# Patient Record
Sex: Female | Born: 1960 | Race: White | Hispanic: No | Marital: Married | Smoking: Former smoker
Health system: Southern US, Community
[De-identification: ages and names within clinical notes are randomized; demographics above are authoritative.]

## PROBLEM LIST (undated history)

## (undated) DIAGNOSIS — R519 Headache, unspecified: Secondary | ICD-10-CM

## (undated) DIAGNOSIS — K219 Gastro-esophageal reflux disease without esophagitis: Secondary | ICD-10-CM

## (undated) DIAGNOSIS — R51 Headache: Secondary | ICD-10-CM

## (undated) DIAGNOSIS — K759 Inflammatory liver disease, unspecified: Secondary | ICD-10-CM

## (undated) DIAGNOSIS — C50911 Malignant neoplasm of unspecified site of right female breast: Secondary | ICD-10-CM

## (undated) DIAGNOSIS — E039 Hypothyroidism, unspecified: Secondary | ICD-10-CM

## (undated) DIAGNOSIS — G43009 Migraine without aura, not intractable, without status migrainosus: Secondary | ICD-10-CM

## (undated) DIAGNOSIS — Z923 Personal history of irradiation: Secondary | ICD-10-CM

## (undated) DIAGNOSIS — C801 Malignant (primary) neoplasm, unspecified: Secondary | ICD-10-CM

## (undated) DIAGNOSIS — F419 Anxiety disorder, unspecified: Secondary | ICD-10-CM

## (undated) DIAGNOSIS — Z9889 Other specified postprocedural states: Secondary | ICD-10-CM

## (undated) DIAGNOSIS — R112 Nausea with vomiting, unspecified: Secondary | ICD-10-CM

## (undated) DIAGNOSIS — E0500 Thyrotoxicosis with diffuse goiter without thyrotoxic crisis or storm: Secondary | ICD-10-CM

## (undated) DIAGNOSIS — M329 Systemic lupus erythematosus, unspecified: Secondary | ICD-10-CM

## (undated) DIAGNOSIS — Z9221 Personal history of antineoplastic chemotherapy: Secondary | ICD-10-CM

## (undated) HISTORY — PX: TUBAL LIGATION: SHX77

## (undated) HISTORY — DX: Malignant neoplasm of unspecified site of right female breast: C50.911

---

## 1995-02-15 DIAGNOSIS — M329 Systemic lupus erythematosus, unspecified: Secondary | ICD-10-CM

## 1995-02-15 HISTORY — DX: Systemic lupus erythematosus, unspecified: M32.9

## 2008-03-31 ENCOUNTER — Emergency Department (HOSPITAL_COMMUNITY): Admission: EM | Admit: 2008-03-31 | Discharge: 2008-03-31 | Payer: Self-pay | Admitting: Emergency Medicine

## 2009-09-01 ENCOUNTER — Emergency Department (HOSPITAL_COMMUNITY): Admission: EM | Admit: 2009-09-01 | Discharge: 2009-09-02 | Payer: Self-pay | Admitting: Emergency Medicine

## 2009-09-02 ENCOUNTER — Emergency Department (HOSPITAL_COMMUNITY): Admission: RE | Admit: 2009-09-02 | Discharge: 2009-09-02 | Payer: Self-pay | Admitting: Emergency Medicine

## 2010-02-16 DIAGNOSIS — F329 Major depressive disorder, single episode, unspecified: Secondary | ICD-10-CM | POA: Insufficient documentation

## 2010-02-16 DIAGNOSIS — M797 Fibromyalgia: Secondary | ICD-10-CM | POA: Insufficient documentation

## 2010-02-16 DIAGNOSIS — F32A Depression, unspecified: Secondary | ICD-10-CM | POA: Insufficient documentation

## 2010-02-16 DIAGNOSIS — E079 Disorder of thyroid, unspecified: Secondary | ICD-10-CM | POA: Insufficient documentation

## 2010-05-01 LAB — HEPATIC FUNCTION PANEL
ALT: 37 U/L — ABNORMAL HIGH (ref 0–35)
AST: 29 U/L (ref 0–37)
Albumin: 4.3 g/dL (ref 3.5–5.2)
Alkaline Phosphatase: 94 U/L (ref 39–117)
Bilirubin, Direct: 0.2 mg/dL (ref 0.0–0.3)
Total Bilirubin: 1.1 mg/dL (ref 0.3–1.2)
Total Protein: 7.5 g/dL (ref 6.0–8.3)

## 2010-05-01 LAB — CBC
HCT: 43.7 % (ref 36.0–46.0)
HCT: 44 % (ref 36.0–46.0)
Hemoglobin: 14.8 g/dL (ref 12.0–15.0)
MCH: 28.2 pg (ref 26.0–34.0)
MCHC: 33.9 g/dL (ref 30.0–36.0)
MCV: 83.3 fL (ref 78.0–100.0)
Platelets: 261 10*3/uL (ref 150–400)
RBC: 5.24 MIL/uL — ABNORMAL HIGH (ref 3.87–5.11)

## 2010-05-01 LAB — DIFFERENTIAL
Basophils Relative: 0 % (ref 0–1)
Eosinophils Absolute: 0.5 10*3/uL (ref 0.0–0.7)
Lymphocytes Relative: 39 % (ref 12–46)
Lymphs Abs: 2.8 10*3/uL (ref 0.7–4.0)
Lymphs Abs: 4.6 10*3/uL — ABNORMAL HIGH (ref 0.7–4.0)
Neutrophils Relative %: 59 % (ref 43–77)

## 2010-05-01 LAB — URINALYSIS, ROUTINE W REFLEX MICROSCOPIC
Glucose, UA: NEGATIVE mg/dL
Nitrite: NEGATIVE
Protein, ur: NEGATIVE mg/dL
Specific Gravity, Urine: >1.03 — ABNORMAL HIGH (ref 1.005–1.030)
pH: 5.5 (ref 5.0–8.0)

## 2010-05-01 LAB — URINE MICROSCOPIC-ADD ON

## 2010-05-01 LAB — AMYLASE: Amylase: 33 U/L (ref 0–105)

## 2010-05-01 LAB — POCT CARDIAC MARKERS
CKMB, poc: <1 ng/mL — ABNORMAL LOW (ref 1.0–8.0)
Troponin i, poc: <0.05 ng/mL (ref 0.00–0.09)

## 2010-05-01 LAB — COMPREHENSIVE METABOLIC PANEL
AST: 33 U/L (ref 0–37)
GFR calc Af Amer: >60 mL/min (ref >60)
GFR calc non Af Amer: >60 mL/min (ref >60)
Glucose, Bld: 107 mg/dL — ABNORMAL HIGH (ref 70–99)
Sodium: 134 mEq/L — ABNORMAL LOW (ref 135–145)

## 2010-05-01 LAB — BASIC METABOLIC PANEL
Chloride: 103 mEq/L (ref 96–112)
GFR calc non Af Amer: >60 mL/min (ref >60)
Glucose, Bld: 103 mg/dL — ABNORMAL HIGH (ref 70–99)

## 2010-05-01 LAB — POCT PREGNANCY, URINE: Preg Test, Ur: NEGATIVE

## 2012-04-05 ENCOUNTER — Emergency Department (HOSPITAL_COMMUNITY)
Admission: EM | Admit: 2012-04-05 | Discharge: 2012-04-05 | Disposition: A | Discharge status: Home / Self Care | Payer: Managed Care, Other (non HMO) | Attending: Emergency Medicine | Admitting: Emergency Medicine

## 2012-04-05 ENCOUNTER — Emergency Department (HOSPITAL_COMMUNITY): Payer: Managed Care, Other (non HMO)

## 2012-04-05 ENCOUNTER — Encounter (HOSPITAL_COMMUNITY): Payer: Self-pay | Admitting: Emergency Medicine

## 2012-04-05 DIAGNOSIS — Z87891 Personal history of nicotine dependence: Secondary | ICD-10-CM | POA: Insufficient documentation

## 2012-04-05 DIAGNOSIS — R0789 Other chest pain: Secondary | ICD-10-CM | POA: Insufficient documentation

## 2012-04-05 DIAGNOSIS — Z8639 Personal history of other endocrine, nutritional and metabolic disease: Secondary | ICD-10-CM | POA: Insufficient documentation

## 2012-04-05 DIAGNOSIS — R6883 Chills (without fever): Secondary | ICD-10-CM | POA: Insufficient documentation

## 2012-04-05 DIAGNOSIS — Z862 Personal history of diseases of the blood and blood-forming organs and certain disorders involving the immune mechanism: Secondary | ICD-10-CM | POA: Insufficient documentation

## 2012-04-05 DIAGNOSIS — R079 Chest pain, unspecified: Secondary | ICD-10-CM

## 2012-04-05 HISTORY — DX: Thyrotoxicosis with diffuse goiter without thyrotoxic crisis or storm: E05.00

## 2012-04-05 LAB — BASIC METABOLIC PANEL
BUN: 11 mg/dL (ref 6–23)
CO2: 24 mEq/L (ref 19–32)
Calcium: 9.5 mg/dL (ref 8.4–10.5)
Chloride: 106 mEq/L (ref 96–112)
Creatinine, Ser: 0.83 mg/dL (ref 0.50–1.10)
GFR calc Af Amer: >90 mL/min (ref >90)
GFR calc non Af Amer: 80 mL/min — ABNORMAL LOW (ref >90)
Glucose, Bld: 89 mg/dL (ref 70–99)
Potassium: 3.9 mEq/L (ref 3.5–5.1)
Sodium: 141 mEq/L (ref 135–145)

## 2012-04-05 LAB — TROPONIN I
Troponin I: <0.3 ng/mL (ref <0.30)
Troponin I: <0.3 ng/mL (ref <0.30)

## 2012-04-05 LAB — CBC
HCT: 41 % (ref 36.0–46.0)
Hemoglobin: 14.3 g/dL (ref 12.0–15.0)
MCH: 29 pg (ref 26.0–34.0)
MCHC: 34.9 g/dL (ref 30.0–36.0)
MCV: 83.2 fL (ref 78.0–100.0)
Platelets: 197 10*3/uL (ref 150–400)
RBC: 4.93 MIL/uL (ref 3.87–5.11)
RDW: 13.2 % (ref 11.5–15.5)
WBC: 5.7 10*3/uL (ref 4.0–10.5)

## 2012-04-05 MED ORDER — ASPIRIN 81 MG PO CHEW
324.0000 mg | CHEWABLE_TABLET | Freq: Once | ORAL | Status: AC
  Administered 2012-04-05: 324 mg via ORAL
  Filled 2012-04-05: qty 4

## 2012-04-05 NOTE — ED Notes (Signed)
Pt c/o stabbing pain to upper left chest and under arm intermittant x 2 weeks. C/o hands intermittantly swelling. Some sob. Denies dizziness/n/v/d/blurred vision. Denies pain worse with movement or eating. Nondiaphoretic. Nad.

## 2012-04-05 NOTE — ED Provider Notes (Signed)
History  This chart was scribed for Tara Razor, MD, by Candelaria Stagers, ED Scribe. This patient was seen in room APA19/AP[facility]19 and the patient's care was started at 11:43 AM   CSN: [id number]  Arrival date & time 04/05/12  1041   First MD Initiated Contact with Patient 04/05/12 1111      Chief Complaint  Patient presents with  . Chest Pain     The history is provided by the patient. No language interpreter was used.   Tara Mcfarland is a 52 y.o. female who presents to the Emergency Department complaining of intermittent left sided chest pain that started two weeks ago and has gradually gotten worse.  She denies any injury or trauma at onset of pain.  She describes the pain as a pressure.  Nothing seems to make the sx better or worse.  She is also experiencing associated sx of swelling of the hands and chills.  She reports she has experienced this pain before, but has never seen anyone about it.  Pt has h/o Graves disease.  She reports she has been tachycardic for about one year c/o Graves disease.  Pt has family h/o heart valve replacement and blood clots.  She has has never experienced a blood clot.  She denies any new leg swelling.  She reports that she had a scan last year which showed the lower left ventricle enlarged.  Pt has taken nothing for pain.   Past Medical History  Diagnosis Date  . Graves disease     Past Surgical History  Procedure Laterality Date  . Cesarean section      History reviewed. No pertinent family history.  History  Substance Use Topics  . Smoking status: Former Games developer  . Smokeless tobacco: Not on file  . Alcohol Use: No    OB History   Grav Para Term Preterm Abortions TAB SAB Ect Mult Living                  Review of Systems  Constitutional: Positive for chills.  Cardiovascular: Positive for chest pain. Negative for leg swelling.  Gastrointestinal: Negative for abdominal pain.  All other systems reviewed and are  negative.    Allergies  Penicillins  Home Medications  No current outpatient prescriptions on file.  BP 120/77  Pulse 89  Temp(Src) 98.3 F (36.8 C) (Oral)  Resp 20  Ht 5\' 5"  (1.651 m)  Wt 176 lb (79.833 kg)  BMI 29.29 kg/m2  SpO2 98%  Physical Exam  Nursing note and vitals reviewed. Constitutional: She is oriented to person, place, and time. She appears well-developed and well-nourished. No distress.  HENT:  Head: Normocephalic and atraumatic.  Eyes: Conjunctivae and EOM are normal.  Neck: Neck supple. No tracheal deviation present.  Cardiovascular: Normal rate and regular rhythm.   Pulmonary/Chest: Effort normal. No respiratory distress. She has no wheezes. She has no rales. She exhibits no tenderness.  Abdominal: She exhibits no distension.  Musculoskeletal: Normal range of motion. She exhibits no edema.  Neurological: She is alert and oriented to person, place, and time. No sensory deficit.  Skin: Skin is dry.  Psychiatric: She has a normal mood and affect. Her behavior is normal.    ED Course  Procedures  DIAGNOSTIC STUDIES: Oxygen Saturation is 98% on room air, normal by my interpretation.    COORDINATION OF CARE:  11:15 AM Ordered 324 mg tablet of aspirin  11:45 AM Discussed plan with pt at bedside which includes chest xray and blood  work. Pt understands and agrees. Pt denies pain medication   Labs Reviewed  BASIC METABOLIC PANEL - Abnormal; Notable for the following:    GFR calc non Af Amer 80 (*)    All other components within normal limits  CBC  TROPONIN I   Dg Chest 2 View  04/05/2012  *RADIOLOGY REPORT*  Clinical Data: Chest pain, former smoker.  CHEST - 2 VIEW  Comparison: None.  Findings: Trachea is midline.  Heart size normal.  Lungs are clear. No pleural fluid.  IMPRESSION: No acute findings.   Original Report Authenticated By: Leanna Battles, M.D.    EKG:  Rhythm: normal sinus Rate: 73 Axis: left Intervals/conduction: abnormal r wave  progression ST segments: NS St changes.    1. Chest pain       MDM          Tara Razor, MD 04/09/12 1453

## 2012-12-26 ENCOUNTER — Other Ambulatory Visit (HOSPITAL_COMMUNITY): Payer: Self-pay | Admitting: Orthopaedic Surgery

## 2012-12-26 DIAGNOSIS — M546 Pain in thoracic spine: Secondary | ICD-10-CM

## 2012-12-26 DIAGNOSIS — T148XXA Other injury of unspecified body region, initial encounter: Secondary | ICD-10-CM

## 2012-12-27 ENCOUNTER — Other Ambulatory Visit (HOSPITAL_COMMUNITY): Payer: Managed Care, Other (non HMO)

## 2014-12-16 DIAGNOSIS — C50911 Malignant neoplasm of unspecified site of right female breast: Secondary | ICD-10-CM

## 2014-12-16 HISTORY — DX: Malignant neoplasm of unspecified site of right female breast: C50.911

## 2014-12-25 DIAGNOSIS — IMO0002 Reserved for concepts with insufficient information to code with codable children: Secondary | ICD-10-CM | POA: Insufficient documentation

## 2014-12-25 DIAGNOSIS — M329 Systemic lupus erythematosus, unspecified: Secondary | ICD-10-CM

## 2014-12-25 DIAGNOSIS — Z8679 Personal history of other diseases of the circulatory system: Secondary | ICD-10-CM | POA: Insufficient documentation

## 2015-01-01 ENCOUNTER — Other Ambulatory Visit: Payer: Self-pay | Admitting: General Surgery

## 2015-01-01 DIAGNOSIS — C50311 Malignant neoplasm of lower-inner quadrant of right female breast: Secondary | ICD-10-CM

## 2015-01-05 ENCOUNTER — Other Ambulatory Visit: Payer: Self-pay | Admitting: General Surgery

## 2015-01-05 DIAGNOSIS — C50311 Malignant neoplasm of lower-inner quadrant of right female breast: Secondary | ICD-10-CM

## 2015-01-14 ENCOUNTER — Encounter (HOSPITAL_BASED_OUTPATIENT_CLINIC_OR_DEPARTMENT_OTHER): Payer: Self-pay | Admitting: *Deleted

## 2015-01-15 ENCOUNTER — Other Ambulatory Visit: Payer: Self-pay

## 2015-01-15 ENCOUNTER — Encounter (HOSPITAL_COMMUNITY)
Admission: RE | Admit: 2015-01-15 | Discharge: 2015-01-15 | Disposition: A | Discharge status: Home / Self Care | Payer: Managed Care, Other (non HMO) | Source: Ambulatory Visit | Attending: General Surgery | Admitting: General Surgery

## 2015-01-15 DIAGNOSIS — Z01818 Encounter for other preprocedural examination: Secondary | ICD-10-CM | POA: Insufficient documentation

## 2015-01-15 DIAGNOSIS — C50911 Malignant neoplasm of unspecified site of right female breast: Secondary | ICD-10-CM | POA: Insufficient documentation

## 2015-01-15 LAB — BASIC METABOLIC PANEL
ANION GAP: 7 (ref 5–15)
BUN: 15 mg/dL (ref 6–20)
CALCIUM: 9.3 mg/dL (ref 8.9–10.3)
CO2: 29 mmol/L (ref 22–32)
Chloride: 105 mmol/L (ref 101–111)
Creatinine, Ser: 0.92 mg/dL (ref 0.44–1.00)
Glucose, Bld: 70 mg/dL (ref 65–99)
Potassium: 4.5 mmol/L (ref 3.5–5.1)
Sodium: 141 mmol/L (ref 135–145)

## 2015-01-15 LAB — URINALYSIS, ROUTINE W REFLEX MICROSCOPIC
Bilirubin Urine: NEGATIVE
Glucose, UA: NEGATIVE mg/dL
KETONES UR: NEGATIVE mg/dL
NITRITE: NEGATIVE
PH: 6 (ref 5.0–8.0)
Protein, ur: NEGATIVE mg/dL
Specific Gravity, Urine: 1.025 (ref 1.005–1.030)

## 2015-01-15 LAB — URINE MICROSCOPIC-ADD ON

## 2015-01-16 ENCOUNTER — Ambulatory Visit
Admission: RE | Admit: 2015-01-16 | Discharge: 2015-01-16 | Disposition: A | Discharge status: Home / Self Care | Payer: Managed Care, Other (non HMO) | Source: Ambulatory Visit | Attending: General Surgery | Admitting: General Surgery

## 2015-01-16 DIAGNOSIS — C50311 Malignant neoplasm of lower-inner quadrant of right female breast: Secondary | ICD-10-CM

## 2015-01-21 ENCOUNTER — Ambulatory Visit (HOSPITAL_COMMUNITY): Payer: Managed Care, Other (non HMO)

## 2015-01-21 ENCOUNTER — Ambulatory Visit (HOSPITAL_BASED_OUTPATIENT_CLINIC_OR_DEPARTMENT_OTHER)
Admission: RE | Admit: 2015-01-21 | Discharge: 2015-01-21 | Disposition: A | Discharge status: Home / Self Care | Payer: Managed Care, Other (non HMO) | Source: Ambulatory Visit | Attending: General Surgery | Admitting: General Surgery

## 2015-01-21 ENCOUNTER — Encounter (HOSPITAL_BASED_OUTPATIENT_CLINIC_OR_DEPARTMENT_OTHER)
Admission: RE | Disposition: A | Discharge status: Home / Self Care | Payer: Self-pay | Source: Ambulatory Visit | Attending: General Surgery

## 2015-01-21 ENCOUNTER — Ambulatory Visit
Admission: RE | Admit: 2015-01-21 | Discharge: 2015-01-21 | Disposition: A | Discharge status: Home / Self Care | Payer: Managed Care, Other (non HMO) | Source: Ambulatory Visit | Attending: General Surgery | Admitting: General Surgery

## 2015-01-21 ENCOUNTER — Ambulatory Visit (HOSPITAL_COMMUNITY)
Admission: RE | Admit: 2015-01-21 | Discharge: 2015-01-21 | Disposition: A | Discharge status: Home / Self Care | Payer: Managed Care, Other (non HMO) | Source: Ambulatory Visit | Attending: General Surgery | Admitting: General Surgery

## 2015-01-21 ENCOUNTER — Encounter (HOSPITAL_BASED_OUTPATIENT_CLINIC_OR_DEPARTMENT_OTHER): Payer: Self-pay | Admitting: Anesthesiology

## 2015-01-21 ENCOUNTER — Ambulatory Visit (HOSPITAL_BASED_OUTPATIENT_CLINIC_OR_DEPARTMENT_OTHER): Payer: Managed Care, Other (non HMO) | Admitting: Anesthesiology

## 2015-01-21 DIAGNOSIS — E039 Hypothyroidism, unspecified: Secondary | ICD-10-CM | POA: Insufficient documentation

## 2015-01-21 DIAGNOSIS — Z87891 Personal history of nicotine dependence: Secondary | ICD-10-CM | POA: Insufficient documentation

## 2015-01-21 DIAGNOSIS — C50311 Malignant neoplasm of lower-inner quadrant of right female breast: Secondary | ICD-10-CM

## 2015-01-21 DIAGNOSIS — Z803 Family history of malignant neoplasm of breast: Secondary | ICD-10-CM | POA: Insufficient documentation

## 2015-01-21 DIAGNOSIS — Z95828 Presence of other vascular implants and grafts: Secondary | ICD-10-CM

## 2015-01-21 DIAGNOSIS — Z419 Encounter for procedure for purposes other than remedying health state, unspecified: Secondary | ICD-10-CM

## 2015-01-21 HISTORY — DX: Headache, unspecified: R51.9

## 2015-01-21 HISTORY — DX: Anxiety disorder, unspecified: F41.9

## 2015-01-21 HISTORY — DX: Gastro-esophageal reflux disease without esophagitis: K21.9

## 2015-01-21 HISTORY — DX: Headache: R51

## 2015-01-21 HISTORY — DX: Malignant (primary) neoplasm, unspecified: C80.1

## 2015-01-21 HISTORY — PX: BREAST LUMPECTOMY: SHX2

## 2015-01-21 HISTORY — DX: Hypothyroidism, unspecified: E03.9

## 2015-01-21 HISTORY — PX: BREAST LUMPECTOMY WITH RADIOACTIVE SEED AND SENTINEL LYMPH NODE BIOPSY: SHX6550

## 2015-01-21 HISTORY — DX: Other specified postprocedural states: R11.2

## 2015-01-21 HISTORY — DX: Other specified postprocedural states: Z98.890

## 2015-01-21 HISTORY — PX: PORTACATH PLACEMENT: SHX2246

## 2015-01-21 SURGERY — BREAST LUMPECTOMY WITH RADIOACTIVE SEED AND SENTINEL LYMPH NODE BIOPSY
Anesthesia: Regional | Site: Chest | Laterality: Right

## 2015-01-21 MED ORDER — CLINDAMYCIN PHOSPHATE 600 MG/50ML IV SOLN
INTRAVENOUS | Status: AC
  Filled 2015-01-21: qty 50

## 2015-01-21 MED ORDER — ACETAMINOPHEN 650 MG RE SUPP: 650.0000 mg | RECTAL | Status: DC | PRN

## 2015-01-21 MED ORDER — FENTANYL CITRATE (PF) 100 MCG/2ML IJ SOLN
INTRAMUSCULAR | Status: DC | PRN
  Administered 2015-01-21: 50 ug via INTRAVENOUS
  Administered 2015-01-21: 100 ug via INTRAVENOUS
  Administered 2015-01-21: 50 ug via INTRAVENOUS

## 2015-01-21 MED ORDER — MEPERIDINE HCL 25 MG/ML IJ SOLN: 6.2500 mg | INTRAMUSCULAR | Status: DC | PRN

## 2015-01-21 MED ORDER — SODIUM CHLORIDE 0.9 % IJ SOLN
INTRAMUSCULAR | Status: AC
  Filled 2015-01-21: qty 10

## 2015-01-21 MED ORDER — HYDROMORPHONE HCL 1 MG/ML IJ SOLN
INTRAMUSCULAR | Status: AC
Start: 2015-01-21 — End: 2015-01-21
  Filled 2015-01-21: qty 1

## 2015-01-21 MED ORDER — GLYCOPYRROLATE 0.2 MG/ML IJ SOLN
INTRAMUSCULAR | Status: AC
  Filled 2015-01-21: qty 2

## 2015-01-21 MED ORDER — FENTANYL CITRATE (PF) 100 MCG/2ML IJ SOLN
50.0000 ug | INTRAMUSCULAR | Status: DC | PRN
  Administered 2015-01-21: 100 ug via INTRAVENOUS

## 2015-01-21 MED ORDER — HEPARIN (PORCINE) IN NACL 2-0.9 UNIT/ML-% IJ SOLN
INTRAMUSCULAR | Status: AC
  Filled 2015-01-21: qty 500

## 2015-01-21 MED ORDER — BUPIVACAINE HCL (PF) 0.5 % IJ SOLN
INTRAMUSCULAR | Status: AC
  Filled 2015-01-21: qty 30

## 2015-01-21 MED ORDER — TECHNETIUM TC 99M SULFUR COLLOID FILTERED
1.0000 | Freq: Once | INTRAVENOUS | Status: AC | PRN
  Administered 2015-01-21: 1 via INTRADERMAL

## 2015-01-21 MED ORDER — PROPOFOL 10 MG/ML IV BOLUS
INTRAVENOUS | Status: DC | PRN
Start: 2015-01-21 — End: 2015-01-21
  Administered 2015-01-21: 200 mg via INTRAVENOUS

## 2015-01-21 MED ORDER — MIDAZOLAM HCL 2 MG/2ML IJ SOLN
INTRAMUSCULAR | Status: AC
  Filled 2015-01-21: qty 2

## 2015-01-21 MED ORDER — LIDOCAINE HCL (CARDIAC) 20 MG/ML IV SOLN
INTRAVENOUS | Status: DC | PRN
  Administered 2015-01-21: 50 mg via INTRAVENOUS

## 2015-01-21 MED ORDER — HEPARIN SOD (PORK) LOCK FLUSH 100 UNIT/ML IV SOLN
INTRAVENOUS | Status: AC
  Filled 2015-01-21: qty 5

## 2015-01-21 MED ORDER — LIDOCAINE HCL (CARDIAC) 20 MG/ML IV SOLN
INTRAVENOUS | Status: AC
  Filled 2015-01-21: qty 5

## 2015-01-21 MED ORDER — LACTATED RINGERS IV SOLN
INTRAVENOUS | Status: DC
  Administered 2015-01-21 (×2): via INTRAVENOUS

## 2015-01-21 MED ORDER — ACETAMINOPHEN 10 MG/ML IV SOLN
INTRAVENOUS | Status: DC | PRN
  Administered 2015-01-21: 1000 mg via INTRAVENOUS

## 2015-01-21 MED ORDER — MIDAZOLAM HCL 2 MG/2ML IJ SOLN
1.0000 mg | INTRAMUSCULAR | Status: DC | PRN
Start: 2015-01-21 — End: 2015-01-21
  Administered 2015-01-21: 2 mg via INTRAVENOUS

## 2015-01-21 MED ORDER — SCOPOLAMINE 1 MG/3DAYS TD PT72
MEDICATED_PATCH | TRANSDERMAL | Status: AC
  Filled 2015-01-21: qty 1

## 2015-01-21 MED ORDER — FENTANYL CITRATE (PF) 100 MCG/2ML IJ SOLN
INTRAMUSCULAR | Status: AC
  Filled 2015-01-21: qty 2

## 2015-01-21 MED ORDER — CIPROFLOXACIN IN D5W 400 MG/200ML IV SOLN: 400.0000 mg | INTRAVENOUS | Status: DC

## 2015-01-21 MED ORDER — ONDANSETRON HCL 4 MG/2ML IJ SOLN
INTRAMUSCULAR | Status: DC | PRN
  Administered 2015-01-21: 4 mg via INTRAVENOUS

## 2015-01-21 MED ORDER — HEPARIN SOD (PORK) LOCK FLUSH 100 UNIT/ML IV SOLN
INTRAVENOUS | Status: DC | PRN
  Administered 2015-01-21: 5 [IU]

## 2015-01-21 MED ORDER — SODIUM CHLORIDE 0.9 % IJ SOLN: 3.0000 mL | INTRAMUSCULAR | Status: DC | PRN

## 2015-01-21 MED ORDER — ATROPINE SULFATE 0.4 MG/ML IJ SOLN
INTRAMUSCULAR | Status: AC
  Filled 2015-01-21: qty 1

## 2015-01-21 MED ORDER — OXYCODONE HCL 5 MG/5ML PO SOLN: 5.0000 mg | Freq: Once | ORAL | Status: AC | PRN

## 2015-01-21 MED ORDER — SODIUM CHLORIDE 0.9 % IJ SOLN: 3.0000 mL | Freq: Two times a day (BID) | INTRAMUSCULAR | Status: DC

## 2015-01-21 MED ORDER — DEXAMETHASONE SODIUM PHOSPHATE 10 MG/ML IJ SOLN
INTRAMUSCULAR | Status: AC
  Filled 2015-01-21: qty 1

## 2015-01-21 MED ORDER — OXYCODONE HCL 5 MG PO TABS
ORAL_TABLET | ORAL | Status: AC
  Filled 2015-01-21: qty 1

## 2015-01-21 MED ORDER — HEPARIN SODIUM (PORCINE) 1000 UNIT/ML IJ SOLN
INTRAMUSCULAR | Status: AC
  Filled 2015-01-21: qty 1

## 2015-01-21 MED ORDER — ONDANSETRON HCL 4 MG/2ML IJ SOLN
INTRAMUSCULAR | Status: AC
  Filled 2015-01-21: qty 2

## 2015-01-21 MED ORDER — HYDROMORPHONE HCL 1 MG/ML IJ SOLN
0.2500 mg | INTRAMUSCULAR | Status: DC | PRN
  Administered 2015-01-21 (×4): 0.5 mg via INTRAVENOUS

## 2015-01-21 MED ORDER — PROMETHAZINE HCL 25 MG/ML IJ SOLN: 6.2500 mg | INTRAMUSCULAR | Status: DC | PRN

## 2015-01-21 MED ORDER — HYDROMORPHONE HCL 1 MG/ML IJ SOLN
INTRAMUSCULAR | Status: AC
  Filled 2015-01-21: qty 1

## 2015-01-21 MED ORDER — BUPIVACAINE-EPINEPHRINE (PF) 0.5% -1:200000 IJ SOLN
INTRAMUSCULAR | Status: DC | PRN
  Administered 2015-01-21: 30 mL

## 2015-01-21 MED ORDER — HEPARIN (PORCINE) IN NACL 2-0.9 UNIT/ML-% IJ SOLN
INTRAMUSCULAR | Status: DC | PRN
  Administered 2015-01-21: 1

## 2015-01-21 MED ORDER — BUPIVACAINE-EPINEPHRINE 0.5% -1:200000 IJ SOLN
INTRAMUSCULAR | Status: DC | PRN
  Administered 2015-01-21: 16 mL

## 2015-01-21 MED ORDER — SODIUM CHLORIDE 0.9 % IJ SOLN
INTRAMUSCULAR | Status: DC | PRN
  Administered 2015-01-21: 5 mL via INTRAMUSCULAR

## 2015-01-21 MED ORDER — DEXAMETHASONE SODIUM PHOSPHATE 4 MG/ML IJ SOLN
INTRAMUSCULAR | Status: DC | PRN
  Administered 2015-01-21: 10 mg via INTRAVENOUS

## 2015-01-21 MED ORDER — PROPOFOL 10 MG/ML IV BOLUS
INTRAVENOUS | Status: AC
  Filled 2015-01-21: qty 40

## 2015-01-21 MED ORDER — CIPROFLOXACIN IN D5W 400 MG/200ML IV SOLN
INTRAVENOUS | Status: AC
  Filled 2015-01-21: qty 200

## 2015-01-21 MED ORDER — OXYCODONE HCL 5 MG PO TABS
5.0000 mg | ORAL_TABLET | Freq: Once | ORAL | Status: AC | PRN
  Administered 2015-01-21: 5 mg via ORAL

## 2015-01-21 MED ORDER — CLINDAMYCIN PHOSPHATE 600 MG/50ML IV SOLN
INTRAVENOUS | Status: DC | PRN
  Administered 2015-01-21: 600 mg via INTRAVENOUS

## 2015-01-21 MED ORDER — LIDOCAINE-EPINEPHRINE (PF) 1 %-1:200000 IJ SOLN
INTRAMUSCULAR | Status: AC
  Filled 2015-01-21: qty 30

## 2015-01-21 MED ORDER — PHENYLEPHRINE HCL 10 MG/ML IJ SOLN
INTRAMUSCULAR | Status: AC
  Filled 2015-01-21: qty 1

## 2015-01-21 MED ORDER — SCOPOLAMINE 1 MG/3DAYS TD PT72
1.0000 | MEDICATED_PATCH | Freq: Once | TRANSDERMAL | Status: DC | PRN
  Administered 2015-01-21: 1.5 mg via TRANSDERMAL

## 2015-01-21 MED ORDER — GLYCOPYRROLATE 0.2 MG/ML IJ SOLN: 0.2000 mg | Freq: Once | INTRAMUSCULAR | Status: DC | PRN

## 2015-01-21 MED ORDER — ACETAMINOPHEN 325 MG PO TABS: 650.0000 mg | ORAL_TABLET | ORAL | Status: DC | PRN

## 2015-01-21 MED ORDER — OXYCODONE HCL 5 MG PO TABS: 5.0000 mg | ORAL_TABLET | ORAL | Status: DC | PRN

## 2015-01-21 MED ORDER — SODIUM CHLORIDE 0.9 % IV SOLN: 250.0000 mL | INTRAVENOUS | Status: DC | PRN

## 2015-01-21 MED ORDER — ACETAMINOPHEN 10 MG/ML IV SOLN
INTRAVENOUS | Status: AC
  Filled 2015-01-21: qty 100

## 2015-01-21 MED ORDER — OXYCODONE-ACETAMINOPHEN 5-325 MG PO TABS: 1.0000 | ORAL_TABLET | ORAL | Status: DC | PRN

## 2015-01-21 MED ORDER — SUCCINYLCHOLINE CHLORIDE 20 MG/ML IJ SOLN
INTRAMUSCULAR | Status: AC
  Filled 2015-01-21: qty 1

## 2015-01-21 MED ORDER — LIDOCAINE HCL (CARDIAC) 20 MG/ML IV SOLN: INTRAVENOUS | Status: DC | PRN

## 2015-01-21 MED ORDER — METHYLENE BLUE 1 % INJ SOLN
INTRAMUSCULAR | Status: AC
Start: 2015-01-21 — End: 2015-01-21
  Filled 2015-01-21: qty 10

## 2015-01-21 MED ORDER — MIDAZOLAM HCL 5 MG/5ML IJ SOLN
INTRAMUSCULAR | Status: DC | PRN
  Administered 2015-01-21: 2 mg via INTRAVENOUS

## 2015-01-21 SURGICAL SUPPLY — 82 items
APPLIER CLIP 9.375 MED OPEN (MISCELLANEOUS)
BAG DECANTER FOR FLEXI CONT (MISCELLANEOUS) ×3 IMPLANT
BINDER BREAST LRG (GAUZE/BANDAGES/DRESSINGS) IMPLANT
BINDER BREAST MEDIUM (GAUZE/BANDAGES/DRESSINGS) IMPLANT
BINDER BREAST XLRG (GAUZE/BANDAGES/DRESSINGS) IMPLANT
BINDER BREAST XXLRG (GAUZE/BANDAGES/DRESSINGS) ×3 IMPLANT
BLADE HEX COATED 2.75 (ELECTRODE) ×3 IMPLANT
BLADE SURG 10 STRL SS (BLADE) ×3 IMPLANT
BLADE SURG 11 STRL SS (BLADE) ×3 IMPLANT
BLADE SURG 15 STRL LF DISP TIS (BLADE) ×2 IMPLANT
BLADE SURG 15 STRL SS (BLADE) ×1
BNDG COHESIVE 4X5 TAN STRL (GAUZE/BANDAGES/DRESSINGS) ×3 IMPLANT
CANISTER SUC SOCK COL 7IN (MISCELLANEOUS) IMPLANT
CANISTER SUCT 1200ML W/VALVE (MISCELLANEOUS) ×3 IMPLANT
CHLORAPREP W/TINT 26ML (MISCELLANEOUS) ×3 IMPLANT
CLIP APPLIE 9.375 MED OPEN (MISCELLANEOUS) IMPLANT
CLIP TI LARGE 6 (CLIP) ×3 IMPLANT
CLIP TI MEDIUM 6 (CLIP) ×12 IMPLANT
CLIP TI WIDE RED SMALL 6 (CLIP) IMPLANT
COVER BACK TABLE 60X90IN (DRAPES) ×3 IMPLANT
COVER MAYO STAND STRL (DRAPES) ×3 IMPLANT
COVER PROBE W GEL 5X96 (DRAPES) ×3 IMPLANT
DECANTER SPIKE VIAL GLASS SM (MISCELLANEOUS) IMPLANT
DERMABOND ADVANCED (GAUZE/BANDAGES/DRESSINGS) ×2
DERMABOND ADVANCED .7 DNX12 (GAUZE/BANDAGES/DRESSINGS) ×4 IMPLANT
DEVICE DUBIN W/COMP PLATE 8390 (MISCELLANEOUS) ×3 IMPLANT
DRAPE C-ARM 42X72 X-RAY (DRAPES) ×3 IMPLANT
DRAPE LAPAROTOMY TRNSV 102X78 (DRAPE) IMPLANT
DRAPE UTILITY XL STRL (DRAPES) ×6 IMPLANT
DRSG PAD ABDOMINAL 8X10 ST (GAUZE/BANDAGES/DRESSINGS) ×3 IMPLANT
DRSG TEGADERM 4X4.75 (GAUZE/BANDAGES/DRESSINGS) IMPLANT
ELECT REM PT RETURN 9FT ADLT (ELECTROSURGICAL) ×3
ELECTRODE REM PT RTRN 9FT ADLT (ELECTROSURGICAL) ×2 IMPLANT
GAUZE SPONGE 4X4 12PLY STRL (GAUZE/BANDAGES/DRESSINGS) ×3 IMPLANT
GLOVE BIO SURGEON STRL SZ 6 (GLOVE) ×6 IMPLANT
GLOVE BIOGEL M 7.0 STRL (GLOVE) ×3 IMPLANT
GLOVE BIOGEL PI IND STRL 6.5 (GLOVE) ×4 IMPLANT
GLOVE BIOGEL PI IND STRL 7.0 (GLOVE) ×6 IMPLANT
GLOVE BIOGEL PI IND STRL 7.5 (GLOVE) ×2 IMPLANT
GLOVE BIOGEL PI INDICATOR 6.5 (GLOVE) ×2
GLOVE BIOGEL PI INDICATOR 7.0 (GLOVE) ×3
GLOVE BIOGEL PI INDICATOR 7.5 (GLOVE) ×1
GLOVE SURG SS PI 6.5 STRL IVOR (GLOVE) ×6 IMPLANT
GOWN STRL REUS W/ TWL LRG LVL3 (GOWN DISPOSABLE) ×6 IMPLANT
GOWN STRL REUS W/TWL 2XL LVL3 (GOWN DISPOSABLE) ×6 IMPLANT
GOWN STRL REUS W/TWL LRG LVL3 (GOWN DISPOSABLE) ×3
ILLUMINATOR WAVEGUIDE N/F (MISCELLANEOUS) IMPLANT
KIT MARKER MARGIN INK (KITS) ×3 IMPLANT
KIT PORT POWER 8FR ISP CVUE (Catheter) ×3 IMPLANT
LIGHT WAVEGUIDE WIDE FLAT (MISCELLANEOUS) ×3 IMPLANT
LIQUID BAND (GAUZE/BANDAGES/DRESSINGS) IMPLANT
NDL SAFETY ECLIPSE 18X1.5 (NEEDLE) ×2 IMPLANT
NEEDLE HYPO 18GX1.5 SHARP (NEEDLE) ×1
NEEDLE HYPO 25X1 1.5 SAFETY (NEEDLE) ×6 IMPLANT
NS IRRIG 1000ML POUR BTL (IV SOLUTION) ×3 IMPLANT
PACK BASIN DAY SURGERY FS (CUSTOM PROCEDURE TRAY) ×3 IMPLANT
PACK UNIVERSAL I (CUSTOM PROCEDURE TRAY) ×3 IMPLANT
PENCIL BUTTON HOLSTER BLD 10FT (ELECTRODE) ×3 IMPLANT
SLEEVE SCD COMPRESS KNEE MED (MISCELLANEOUS) ×3 IMPLANT
SPONGE GAUZE 4X4 12PLY STER LF (GAUZE/BANDAGES/DRESSINGS) ×3 IMPLANT
SPONGE LAP 18X18 X RAY DECT (DISPOSABLE) ×6 IMPLANT
STAPLER VISISTAT 35W (STAPLE) IMPLANT
STOCKINETTE IMPERVIOUS LG (DRAPES) ×3 IMPLANT
STRIP CLOSURE SKIN 1/2X4 (GAUZE/BANDAGES/DRESSINGS) ×3 IMPLANT
SUT ETHILON 2 0 FS 18 (SUTURE) IMPLANT
SUT MNCRL AB 4-0 PS2 18 (SUTURE) ×6 IMPLANT
SUT MON AB 5-0 PS2 18 (SUTURE) IMPLANT
SUT PROLENE 2 0 SH DA (SUTURE) ×6 IMPLANT
SUT SILK 2 0 SH (SUTURE) IMPLANT
SUT VIC AB 2-0 SH 27 (SUTURE) ×1
SUT VIC AB 2-0 SH 27XBRD (SUTURE) ×2 IMPLANT
SUT VIC AB 3-0 SH 27 (SUTURE) ×1
SUT VIC AB 3-0 SH 27X BRD (SUTURE) ×2 IMPLANT
SUT VIC AB 5-0 PS2 18 (SUTURE) IMPLANT
SUT VICRYL 3-0 CR8 SH (SUTURE) ×3 IMPLANT
SYR 5ML LUER SLIP (SYRINGE) ×3 IMPLANT
SYR CONTROL 10ML LL (SYRINGE) ×6 IMPLANT
SYRINGE 10CC LL (SYRINGE) ×3 IMPLANT
TOWEL OR 17X24 6PK STRL BLUE (TOWEL DISPOSABLE) ×3 IMPLANT
TOWEL OR NON WOVEN STRL DISP B (DISPOSABLE) ×3 IMPLANT
TUBE CONNECTING 20X1/4 (TUBING) ×3 IMPLANT
YANKAUER SUCT BULB TIP NO VENT (SUCTIONS) ×3 IMPLANT

## 2015-01-21 NOTE — H&P (Signed)
Tara Mcfarland 01/01/2015 1:51 PM Location: [facility] Patient #: [id number] DOB: 04-13-1960 Married / Language: Tara Mcfarland / Race: White Female   History of Present Illness Stark Klein MD; 01/01/2015 2:46 PM) The patient is a 54 year old female who presents with breast cancer. Patient is seen in consultation at the request of Dr. Jacquiline Doe for a new diagnosis of right triple negative breast cancer. This patient is a 54 year old female who presented with an abnormal right mammogram. She underwent diagnostic procedures which showed a 1.3 cm abnormal-appearing mass at the 5 o'clock position. This was very posterior and the patient reports that it was far from the nipple for the biopsy. The patient has not ever palpated a mass and has never had a callback from a mammogram before. She has a family history of a paternal grandmother with colon cancer and a paternal great great-grandmother with breast cancer. Both of these were more diagnosed in their late 1s.  She had menarche at age 71. She did take oral contraceptives. She is a G3 P3. First child was born between ages 43 and 78.   Other Problems Elbert Ewings, CMA; 01/01/2015 1:51 PM) Back Pain Breast Cancer Chest pain Gastroesophageal Reflux Disease Heart murmur Lump In Breast Migraine Headache Thyroid Disease Transfusion history  Past Surgical History Elbert Ewings, CMA; 01/01/2015 1:51 PM) Breast Biopsy Right. Cesarean Section - 1  Diagnostic Studies History Elbert Ewings, CMA; 01/01/2015 1:51 PM) Colonoscopy 1-5 years ago Mammogram within last year Pap Smear 1-5 years ago  Allergies Elbert Ewings, CMA; 01/01/2015 1:51 PM) Penicillin V *PENICILLINS* Rash.  Medication History Elbert Ewings, CMA; 01/01/2015 1:53 PM) TraMADol HCl ($RemoveBefor'50MG'sFXqmRRZZizR$  Tablet, Oral as needed) Active. Flexeril ($RemoveBeforeDEI'10MG'jlGczecFcOHzuWnl$  Tablet, Oral) Active. Levothyroxine Sodium (112MCG Tablet, Oral) Active. TraMADol HCl ER ($Remov'100MG'qxXkVu$  Tablet ER  24HR, Oral) Active. Lidocaine (5% Patch, External) Active. Vitamin D3 (3000UNIT Tablet, Oral) Active. Calcium Carbonate (1250 (500 Ca)MG Tablet, Oral) Active. Medications Reconciled  Pregnancy / Birth History Elbert Ewings, CMA; 01/01/2015 1:51 PM) Age at menarche 21 years. Contraceptive History Oral contraceptives. Gravida 3 Irregular periods Maternal age 78-20 Para 3    Review of Systems Elbert Ewings CMA; 01/01/2015 1:51 PM) General Not Present- Appetite Loss, Chills, Fatigue, Fever, Night Sweats, Weight Gain and Weight Loss. Skin Present- Dryness and Hives. Not Present- Change in Wart/Mole, Jaundice, New Lesions, Non-Healing Wounds, Rash and Ulcer. HEENT Present- Visual Disturbances and Wears glasses/contact lenses. Not Present- Earache, Hearing Loss, Hoarseness, Nose Bleed, Oral Ulcers, Ringing in the Ears, Seasonal Allergies, Sinus Pain, Sore Throat and Yellow Eyes. Respiratory Present- Snoring. Not Present- Bloody sputum, Chronic Cough, Difficulty Breathing and Wheezing. Breast Present- Breast Mass. Not Present- Breast Pain, Nipple Discharge and Skin Changes. Cardiovascular Present- Chest Pain, Leg Cramps and Rapid Heart Rate. Not Present- Difficulty Breathing Lying Down, Palpitations, Shortness of Breath and Swelling of Extremities. Female Genitourinary Present- Frequency, Nocturia and Urgency. Not Present- Painful Urination and Pelvic Pain. Musculoskeletal Present- Back Pain, Joint Pain and Muscle Pain. Not Present- Joint Stiffness, Muscle Weakness and Swelling of Extremities. Neurological Present- Headaches, Numbness and Tingling. Not Present- Decreased Memory, Fainting, Seizures, Tremor, Trouble walking and Weakness. Psychiatric Not Present- Anxiety, Bipolar, Change in Sleep Pattern, Depression, Fearful and Frequent crying. Endocrine Present- Hot flashes. Not Present- Cold Intolerance, Excessive Hunger, Hair Changes, Heat Intolerance and New Diabetes. Hematology Not  Present- Easy Bruising, Excessive bleeding, Gland problems, HIV and Persistent Infections.  Vitals Elbert Ewings CMA; 01/01/2015 1:54 PM) 01/01/2015 1:53 PM Weight: 195 lb Height: 63.5in Body Surface  Area: 1.92 m Body Mass Index: 34 kg/m  Temp.: 91F(Temporal)  Pulse: 63 (Regular)  BP: 130/72 (Sitting, Left Arm, Standard)       Physical Exam Stark Klein MD; 01/01/2015 2:46 PM) General Mental Status-Alert. General Appearance-Consistent with stated age. Hydration-Well hydrated. Voice-Normal.  Head and Neck Head-normocephalic, atraumatic with no lesions or palpable masses. Trachea-midline. Thyroid Gland Characteristics - normal size and consistency.  Eye Eyeball - Bilateral-Extraocular movements intact. Sclera/Conjunctiva - Bilateral-No scleral icterus.  Chest and Lung Exam Chest and lung exam reveals -quiet, even and easy respiratory effort with no use of accessory muscles and on auscultation, normal breath sounds, no adventitious sounds and normal vocal resonance. Inspection Chest Wall - Normal. Back - normal.  Breast Note: Breasts are slightly ptotic bilaterally. There is no asymmetry to the breast. She has no skin dimpling, nipple retraction, or nipple discharge. There is no palpable mass. The patient has no axillary lymphadenopathy. She has no other skin changes.   Cardiovascular Cardiovascular examination reveals -normal heart sounds, regular rate and rhythm with no murmurs and normal pedal pulses bilaterally.  Abdomen Inspection Inspection of the abdomen reveals - No Hernias. Palpation/Percussion Palpation and Percussion of the abdomen reveal - Soft, Non Tender, No Rebound tenderness, No Rigidity (guarding) and No hepatosplenomegaly. Auscultation Auscultation of the abdomen reveals - Bowel sounds normal.  Neurologic Neurologic evaluation reveals -alert and oriented x 3 with no impairment of recent or remote memory. Mental  Status-Normal.  Musculoskeletal Global Assessment -Note: no gross deformities.  Normal Exam - Left-Upper Extremity Strength Normal and Lower Extremity Strength Normal. Normal Exam - Right-Upper Extremity Strength Normal and Lower Extremity Strength Normal.  Lymphatic Head & Neck  General Head & Neck Lymphatics: Bilateral - Description - Normal. Axillary  General Axillary Region: Bilateral - Description - Normal. Tenderness - Non Tender. Femoral & Inguinal  Generalized Femoral & Inguinal Lymphatics: Bilateral - Description - No Generalized lymphadenopathy.    Assessment & Plan Stark Klein MD; 01/01/2015 2:48 PM) PRIMARY CANCER OF LOWER-INNER QUADRANT OF RIGHT FEMALE BREAST (C50.311) Impression: Patient has a new diagnosis of a clinical T1c and 0 M0 right triple negative breast cancer with Ki-67 of 80%.  She appears to be a good candidate for a lumpectomy. We discussed the potential role of MRI and are not planning on getting one due to the lack of evidence that it changes survival. She has extensively discussed with Dr. Alto Denver the chemotherapy planned. She will need a right breast seems localized lumpectomy, sentinel lymph node biopsy, and port placement. I discussed all of these procedures with the patient.  The surgical procedure was described to the patient. I discussed the incision type and location and that we would need radiology involved on with a wire or seed marker and sentinel node.  The risks and benefits of the procedure were described to the patient and she wishes to proceed. Discussed the risk of pneumothorax with the patient regarding port.  We discussed the risks bleeding, infection, damage to other structures, need for further procedures/surgeries. We discussed the risk of seroma. The patient was advised if the area in the breast in cancer, we may need to go back to surgery for additional tissue to obtain negative margins or for a lymph node biopsy. The  patient was advised that these are the most common complications, but that others can occur as well. They were advised against taking aspirin or other anti-inflammatory agents/blood thinners the week before surgery. Current Plans Pt Education - [facility] breast cancer surgery:  discussed with patient and provided information. Pt Education - [facility] Breast Cancer Information Given - [facility] "Breast Journey" Package You are being scheduled for surgery - Our schedulers will call you.  You should hear from our office's scheduling department within 5 working days about the location, date, and time of surgery. We try to make accommodations for patient's preferences in scheduling surgery, but sometimes the OR schedule or the surgeon's schedule prevents [place] from making those accommodations.  If you have not heard from our office [phone]) in 5 working days, call the office and ask for your surgeon's nurse.  If you have other questions about your diagnosis, plan, or surgery, call the office and ask for your surgeon's nurse.  Pt Education - [facility] port insertion education   Signed by Stark Klein, MD (01/01/2015 2:49 PM)

## 2015-01-21 NOTE — Progress Notes (Signed)
Assisted Dr. Germeroth with right, ultrasound guided, pectoralis block. Side rails up, monitors on throughout procedure. See vital signs in flow sheet. Tolerated Procedure well. 

## 2015-01-21 NOTE — Anesthesia Preprocedure Evaluation (Addendum)
Anesthesia Evaluation  Patient identified by MRN, date of birth, ID band Patient awake    Reviewed: Allergy & Precautions, NPO status , Patient's Chart, lab work & pertinent test results  History of Anesthesia Complications (+) PONV and history of anesthetic complications  Airway Mallampati: III  TM Distance: >3 FB Neck ROM: Full    Dental no notable dental hx.    Pulmonary neg pulmonary ROS, former smoker,    Pulmonary exam normal breath sounds clear to auscultation       Cardiovascular negative cardio ROS Normal cardiovascular exam Rhythm:Regular Rate:Normal     Neuro/Psych  Headaches, Seizures -,  negative psych ROS   GI/Hepatic Neg liver ROS, GERD  ,  Endo/Other  Hypothyroidism Hyperthyroidism   Renal/GU negative Renal ROS     Musculoskeletal negative musculoskeletal ROS (+)   Abdominal   Peds  Hematology negative hematology ROS (+)   Anesthesia Other Findings   Reproductive/Obstetrics negative OB ROS                           Anesthesia Physical Anesthesia Plan  ASA: II  Anesthesia Plan: General and Regional   Post-op Pain Management: MAC Combined w/ Regional for Post-op pain   Induction: Intravenous  Airway Management Planned: LMA  Additional Equipment:   Intra-op Plan:   Post-operative Plan: Extubation in OR  Informed Consent: I have reviewed the patients History and Physical, chart, labs and discussed the procedure including the risks, benefits and alternatives for the proposed anesthesia with the patient or authorized representative who has indicated his/her understanding and acceptance.   Dental advisory given  Plan Discussed with: CRNA  Anesthesia Plan Comments:         Anesthesia Quick Evaluation

## 2015-01-21 NOTE — Op Note (Signed)
Right Breast Radioactive seed localized lumpectomy and sentinel lymph node biopsy, port a cath placement  Indications: This patient presents with history of right breast cancer, cT1N0  Pre-operative Diagnosis: right breast cancer, lower inner quadrant  Post-operative Diagnosis: Same  Surgeon: Stark Klein   Anesthesia: General endotracheal anesthesia  ASA Class: 2  Procedure Details  The patient was seen in the Holding Room. The risks, benefits, complications, treatment options, and expected outcomes were discussed with the patient. The possibilities of bleeding, infection, the need for additional procedures, failure to diagnose a condition, and creating a complication requiring transfusion or operation were discussed with the patient. The patient concurred with the proposed plan, giving informed consent.  The site of surgery properly noted/marked. The patient was taken to Operating Room # 8, identified, and the procedure verified as Right Breast Seed Localized Lumpectomy with sentinel lymph node biopsy and port placement. A Time Out was held and the above information confirmed.  The right arm, breast, and chest were prepped and draped in standard fashion.  Local anesthetic was administered over this area at the angle of the clavicle.  The vein was accessed with 1 pass of the needle. There was good venous return and the wire passed easily with no ectopy.   Fluoroscopy was used to confirm that the wire was in the vena cava.      The patient was placed back level and the area for the pocket was anethetized   with local anesthetic.  A 3-cm transverse incision was made with a #15   blade.  Cautery was used to divide the subcutaneous tissues down to the   pectoralis muscle.  An [employer] retractor was used to elevate the skin   while a pocket was created on top of the pectoralis fascia.  The port   was placed into the pocket to confirm that it was of adequate size.  The   catheter was  preattached to the port.  The port was then secured to the   pectoralis fascia with four 2-0 Prolene sutures.  These were clamped and   not tied down yet.    The catheter was tunneled through to the wire exit   site.  The catheter was placed along the wire to determine what length it should be to be in the SVC.  The catheter was cut at 21.5 cm.  The tunneler sheath and dilator were passed over the wire and the dilator and wire were removed.  The catheter was advanced through the tunneler sheath and the tunneler sheath was pulled away.  Care was taken to keep the catheter in the tunneler sheath as this occurred. This was advanced and the tunneler sheath was removed.  There was good venous   return and easy flush of the catheter.  The Prolene sutures were tied   down to the pectoral fascia.  The skin was reapproximated using 3-0   Vicryl interrupted deep dermal sutures.    Fluoroscopy was used to re-confirm good position of the catheter.  The skin   was then closed using 4-0 Monocryl in a subcuticular fashion.  The port was flushed with concentrated heparin flush as well.    The lumpectomy was performed by creating an transverse incision over the lower inner quadrant of the breast over the previously placed radioactive seed.  Dissection was carried down to around the point of maximum signal intensity. The cautery was used to perform the dissection.  Hemostasis was achieved with cautery. The edges of the  cavity were marked with large clips, with one each medial, lateral, inferior and superior, and two clips posteriorly.   The specimen was inked with the margin marker paint kit.    Specimen radiography confirmed inclusion of the mammographic lesion, the clip, and the seed.  The background signal in the breast was zero.  The wound was irrigated and closed with 3-0 vicryl in layers and 4-0 monocryl subcuticular suture.    Using a hand-held gamma probe, right axillary sentinel nodes were identified  transcutaneously.  An oblique incision was created below the axillary hairline.  Dissection was carried through the clavipectoral fascia.  Four level 2 axillary sentinel nodes were removed.  Counts per second were 250, 30, 90, and 350.Marland Kitchen    The background count was 8 cps.  The wound was irrigated.  Hemostasis was achieved with cautery.  The axillary incision was closed with a 3-0 vicryl deep dermal interrupted sutures and a 4-0 monocryl subcuticular closure.    Sterile dressings were applied. At the end of the operation, all sponge, instrument, and needle counts were correct.  Findings: grossly clear surgical margins and no adenopathy.  Posterior margin is pectoralis.  A portion of the inferior pectoralis muscle was taken with the specimen, and this posterior margin was grossly negative.  Inferior and anterior margin is skin.    Estimated Blood Loss:  min         Specimens: Right breast lumpectomy and four axillary sentinel lymph nodes.             Complications:  None; patient tolerated the procedure well.         Disposition: PACU - hemodynamically stable.         Condition: stable

## 2015-01-21 NOTE — Discharge Instructions (Addendum)
[facility] °Office Phone Number [phone] ° °BREAST BIOPSY/ PARTIAL MASTECTOMY: POST OP INSTRUCTIONS ° °Always review your discharge instruction sheet given to you by the facility where your surgery was performed. ° °IF YOU HAVE DISABILITY OR FAMILY LEAVE FORMS, YOU MUST BRING THEM TO THE OFFICE FOR PROCESSING.  DO NOT GIVE THEM TO YOUR DOCTOR. ° °1. A prescription for pain medication may be given to you upon discharge.  Take your pain medication as prescribed, if needed.  If narcotic pain medicine is not needed, then you may take acetaminophen (Tylenol) or ibuprofen (Advil) as needed. °2. Take your usually prescribed medications unless otherwise directed °3. If you need a refill on your pain medication, please contact your pharmacy.  They will contact our office to request authorization.  Prescriptions will not be filled after 5pm or on week-ends. °4. You should eat very light the first 24 hours after surgery, such as soup, crackers, pudding, etc.  Resume your normal diet the day after surgery. °5. Most patients will experience some swelling and bruising in the breast.  Ice packs and a good support bra will help.  Swelling and bruising can take several days to resolve.  °6. It is common to experience some constipation if taking pain medication after surgery.  Increasing fluid intake and taking a stool softener will usually help or prevent this problem from occurring.  A mild laxative (Milk of Magnesia or Miralax) should be taken according to package directions if there are no bowel movements after 48 hours. °7. Unless discharge instructions indicate otherwise, you may remove your bandages 48 hours after surgery, and you may shower at that time.  You may have steri-strips (small skin tapes) in place directly over the incision.  These strips should be left on the skin for 7-10 days.   Any sutures or staples will be removed at the office during your follow-up visit. °8. ACTIVITIES:  You may resume  regular daily activities (gradually increasing) beginning the next day.  Wearing a good support bra or sports bra (or the breast binder) minimizes pain and swelling.  You may have sexual intercourse when it is comfortable. °a. You may drive when you no longer are taking prescription pain medication, you can comfortably wear a seatbelt, and you can safely maneuver your car and apply brakes. °b. RETURN TO WORK:  __________1 week_______________ °9. You should see your doctor in the office for a follow-up appointment approximately two weeks after your surgery.  Your doctor’s nurse will typically make your follow-up appointment when she calls you with your pathology report.  Expect your pathology report 2-3 business days after your surgery.  You may call to check if you do not hear from us after three days. ° ° °WHEN TO CALL YOUR DOCTOR: °1. Fever over 101.0 °2. Nausea and/or vomiting. °3. Extreme swelling or bruising. °4. Continued bleeding from incision. °5. Increased pain, redness, or drainage from the incision. ° °The clinic staff is available to answer your questions during regular business hours.  Please don’t hesitate to call and ask to speak to one of the nurses for clinical concerns.  If you have a medical emergency, go to the nearest emergency room or call 911.  A surgeon from [facility] is always on call at the hospital. ° °For further questions, please visit [email or web address]  ° ° °Post Anesthesia Home Care Instructions ° °Activity: °Get plenty of rest for the remainder of the day. A responsible adult should stay with you for 24   hours following the procedure.  °For the next 24 hours, DO NOT: °-Drive a car °-Operate machinery °-Drink alcoholic beverages °-Take any medication unless instructed by your physician °-Make any legal decisions or sign important papers. ° °Meals: °Start with liquid foods such as gelatin or soup. Progress to regular foods as tolerated. Avoid greasy, spicy, heavy  foods. If nausea and/or vomiting occur, drink only clear liquids until the nausea and/or vomiting subsides. Call your physician if vomiting continues. ° °Special Instructions/Symptoms: °Your throat may feel dry or sore from the anesthesia or the breathing tube placed in your throat during surgery. If this causes discomfort, gargle with warm salt water. The discomfort should disappear within 24 hours. ° °If you had a scopolamine patch placed behind your ear for the management of post- operative nausea and/or vomiting: ° °1. The medication in the patch is effective for 72 hours, after which it should be removed.  Wrap patch in a tissue and discard in the trash. Wash hands thoroughly with soap and water. °2. You may remove the patch earlier than 72 hours if you experience unpleasant side effects which may include dry mouth, dizziness or visual disturbances. °3. Avoid touching the patch. Wash your hands with soap and water after contact with the patch. °  ° °

## 2015-01-21 NOTE — Anesthesia Procedure Notes (Addendum)
Anesthesia Regional Block:  Pectoralis block  Pre-Anesthetic Checklist: ,, timeout performed, Correct Patient, Correct Site, Correct Laterality, Correct Procedure, Correct Position, site marked, Risks and benefits discussed, Surgical consent,  Pre-op evaluation,  Post-op pain management  Laterality: Right  Prep: chloraprep       Needles:   Needle Type: Stimiplex     Needle Length: 9cm 9 cm     Additional Needles:  Procedures: ultrasound guided (picture in chart) Pectoralis block Narrative:  Injection made incrementally with aspirations every 5 mL.  Performed by: Personally  Anesthesiologist: Nolon Nations  Additional Notes: Patient tolerated well. Good fascial spread noted.   Procedure Name: LMA Insertion Date/Time: 01/21/2015 11:02 AM Performed by: Toula Moos L Pre-anesthesia Checklist: Patient identified, Emergency Drugs available, Suction available, Patient being monitored and Timeout performed Patient Re-evaluated:Patient Re-evaluated prior to inductionOxygen Delivery Method: Circle System Utilized Preoxygenation: Pre-oxygenation with 100% oxygen Intubation Type: IV induction Ventilation: Mask ventilation without difficulty LMA: LMA inserted LMA Size: 4.0 Number of attempts: 1 Airway Equipment and Method: Bite block Placement Confirmation: positive ETCO2 Tube secured with: Tape Dental Injury: Teeth and Oropharynx as per pre-operative assessment

## 2015-01-21 NOTE — Progress Notes (Signed)
Radiology staff performed nuc med inj. No additional sedation required. Pt tol well. Will bring husband (JR) to bedside and update/provide emot support

## 2015-01-21 NOTE — Transfer of Care (Signed)
Immediate Anesthesia Transfer of Care Note  Patient: Tara Mcfarland  Procedure(s) Performed: Procedure(s): RIGHT BREAST LUMPECTOMY WITH RADIOACTIVE SEED AND RIGHT SENTINEL LYMPH NODE BIOPSY (Right) INSERTION PORT-A-CATH (Left)  Patient Location: PACU  Anesthesia Type:GA combined with regional for post-op pain  Level of Consciousness: sedated  Airway & Oxygen Therapy: Patient Spontanous Breathing and Patient connected to face mask oxygen  Post-op Assessment: Report given to RN and Post -op Vital signs reviewed and stable  Post vital signs: Reviewed and stable  Last Vitals:  Filed Vitals:   01/21/15 0951 01/21/15 0955  BP: 139/73 139/70  Pulse: 81 97  Temp:    Resp: 15 17    Complications: No apparent anesthesia complications

## 2015-01-22 ENCOUNTER — Encounter (HOSPITAL_BASED_OUTPATIENT_CLINIC_OR_DEPARTMENT_OTHER): Payer: Self-pay | Admitting: General Surgery

## 2015-01-22 NOTE — Anesthesia Postprocedure Evaluation (Signed)
Anesthesia Post Note  Patient: Tara Mcfarland  Procedure(s) Performed: Procedure(s) (LRB): RIGHT BREAST LUMPECTOMY WITH RADIOACTIVE SEED AND RIGHT SENTINEL LYMPH NODE BIOPSY (Right) INSERTION PORT-A-CATH (Left)  Patient location during evaluation: PACU Anesthesia Type: General Level of consciousness: sedated and patient cooperative Pain management: pain level controlled Vital Signs Assessment: post-procedure vital signs reviewed and stable Respiratory status: spontaneous breathing Cardiovascular status: stable Anesthetic complications: no    Last Vitals:  Filed Vitals:   01/21/15 1515 01/21/15 1600  BP:  139/75  Pulse: 81 81  Temp:  36.4 C  Resp: 14 16    Last Pain:  Filed Vitals:   01/21/15 1659  PainSc: [place]

## 2015-01-26 NOTE — Progress Notes (Signed)
Quick Note:  Please let patient know margins are negative and LN are negative! ______ 

## 2015-02-11 DIAGNOSIS — Z7952 Long term (current) use of systemic steroids: Secondary | ICD-10-CM | POA: Insufficient documentation

## 2015-02-27 DIAGNOSIS — Z95828 Presence of other vascular implants and grafts: Secondary | ICD-10-CM | POA: Insufficient documentation

## 2015-02-27 DIAGNOSIS — Z5111 Encounter for antineoplastic chemotherapy: Secondary | ICD-10-CM | POA: Insufficient documentation

## 2015-03-02 DIAGNOSIS — Z9221 Personal history of antineoplastic chemotherapy: Secondary | ICD-10-CM

## 2015-03-02 HISTORY — DX: Personal history of antineoplastic chemotherapy: Z92.21

## 2015-04-01 DIAGNOSIS — D701 Agranulocytosis secondary to cancer chemotherapy: Secondary | ICD-10-CM | POA: Insufficient documentation

## 2015-04-01 DIAGNOSIS — T451X5A Adverse effect of antineoplastic and immunosuppressive drugs, initial encounter: Secondary | ICD-10-CM | POA: Insufficient documentation

## 2015-04-01 DIAGNOSIS — R110 Nausea: Secondary | ICD-10-CM | POA: Insufficient documentation

## 2015-05-28 DIAGNOSIS — G629 Polyneuropathy, unspecified: Secondary | ICD-10-CM | POA: Insufficient documentation

## 2015-07-01 DIAGNOSIS — L309 Dermatitis, unspecified: Secondary | ICD-10-CM | POA: Insufficient documentation

## 2015-07-06 ENCOUNTER — Other Ambulatory Visit: Payer: Self-pay | Admitting: Radiation Oncology

## 2015-07-06 DIAGNOSIS — Z853 Personal history of malignant neoplasm of breast: Secondary | ICD-10-CM

## 2015-07-06 DIAGNOSIS — N6312 Unspecified lump in the right breast, upper inner quadrant: Secondary | ICD-10-CM

## 2015-07-08 ENCOUNTER — Ambulatory Visit
Admission: RE | Admit: 2015-07-08 | Discharge: 2015-07-08 | Disposition: A | Discharge status: Home / Self Care | Payer: Managed Care, Other (non HMO) | Source: Ambulatory Visit | Attending: Radiation Oncology | Admitting: Radiation Oncology

## 2015-07-08 DIAGNOSIS — N6312 Unspecified lump in the right breast, upper inner quadrant: Secondary | ICD-10-CM

## 2015-07-08 DIAGNOSIS — M549 Dorsalgia, unspecified: Secondary | ICD-10-CM

## 2015-07-08 DIAGNOSIS — Z853 Personal history of malignant neoplasm of breast: Secondary | ICD-10-CM

## 2015-07-08 DIAGNOSIS — G8929 Other chronic pain: Secondary | ICD-10-CM | POA: Insufficient documentation

## 2015-07-22 DIAGNOSIS — L270 Generalized skin eruption due to drugs and medicaments taken internally: Secondary | ICD-10-CM | POA: Insufficient documentation

## 2015-09-01 ENCOUNTER — Encounter (HOSPITAL_COMMUNITY): Payer: Medicaid Other | Attending: Hematology & Oncology | Admitting: Hematology & Oncology

## 2015-09-01 ENCOUNTER — Ambulatory Visit (HOSPITAL_COMMUNITY): Payer: Self-pay

## 2015-09-01 ENCOUNTER — Encounter (HOSPITAL_COMMUNITY): Payer: Self-pay | Admitting: Hematology & Oncology

## 2015-09-01 VITALS — BP 124/66 | HR 73 | Temp 98.4°F | Resp 16 | Ht 63.0 in | Wt 206.0 lb

## 2015-09-01 DIAGNOSIS — C50911 Malignant neoplasm of unspecified site of right female breast: Secondary | ICD-10-CM | POA: Insufficient documentation

## 2015-09-01 DIAGNOSIS — C50311 Malignant neoplasm of lower-inner quadrant of right female breast: Secondary | ICD-10-CM

## 2015-09-01 DIAGNOSIS — M545 Low back pain, unspecified: Secondary | ICD-10-CM

## 2015-09-01 DIAGNOSIS — M329 Systemic lupus erythematosus, unspecified: Secondary | ICD-10-CM

## 2015-09-01 DIAGNOSIS — Z171 Estrogen receptor negative status [ER-]: Secondary | ICD-10-CM

## 2015-09-01 DIAGNOSIS — M810 Age-related osteoporosis without current pathological fracture: Secondary | ICD-10-CM

## 2015-09-01 LAB — CBC WITH DIFFERENTIAL/PLATELET
BASOS ABS: 0 10*3/uL (ref 0.0–0.1)
BASOS PCT: 1 %
EOS ABS: 0.6 10*3/uL (ref 0.0–0.7)
Eosinophils Relative: 10 %
HEMATOCRIT: 39.6 % (ref 36.0–46.0)
HEMOGLOBIN: 13.4 g/dL (ref 12.0–15.0)
Lymphocytes Relative: 28 %
Lymphs Abs: 1.8 10*3/uL (ref 0.7–4.0)
MCH: 29.7 pg (ref 26.0–34.0)
MCHC: 33.8 g/dL (ref 30.0–36.0)
MCV: 87.8 fL (ref 78.0–100.0)
MONOS PCT: 8 %
Monocytes Absolute: 0.5 10*3/uL (ref 0.1–1.0)
NEUTROS ABS: 3.5 10*3/uL (ref 1.7–7.7)
NEUTROS PCT: 53 %
Platelets: 259 10*3/uL (ref 150–400)
RBC: 4.51 MIL/uL (ref 3.87–5.11)
RDW: 12.7 % (ref 11.5–15.5)
WBC: 6.5 10*3/uL (ref 4.0–10.5)

## 2015-09-01 LAB — COMPREHENSIVE METABOLIC PANEL
ALBUMIN: 4.3 g/dL (ref 3.5–5.0)
ALK PHOS: 57 U/L (ref 38–126)
ALT: 41 U/L (ref 14–54)
ANION GAP: 6 (ref 5–15)
AST: 34 U/L (ref 15–41)
BILIRUBIN TOTAL: 1.2 mg/dL (ref 0.3–1.2)
BUN: 11 mg/dL (ref 6–20)
CALCIUM: 8.8 mg/dL — AB (ref 8.9–10.3)
CO2: 23 mmol/L (ref 22–32)
Chloride: 108 mmol/L (ref 101–111)
Creatinine, Ser: 0.74 mg/dL (ref 0.44–1.00)
GFR calc Af Amer: >60 mL/min (ref >60)
GFR calc non Af Amer: >60 mL/min (ref >60)
GLUCOSE: 89 mg/dL (ref 65–99)
Potassium: 3.7 mmol/L (ref 3.5–5.1)
SODIUM: 137 mmol/L (ref 135–145)
Total Protein: 7.4 g/dL (ref 6.5–8.1)

## 2015-09-01 MED ORDER — SODIUM CHLORIDE 0.9% FLUSH
10.0000 mL | INTRAVENOUS | Status: DC | PRN
  Administered 2015-09-01: 10 mL via INTRAVENOUS
  Filled 2015-09-01: qty 10

## 2015-09-01 MED ORDER — HEPARIN SOD (PORK) LOCK FLUSH 100 UNIT/ML IV SOLN
500.0000 [IU] | Freq: Once | INTRAVENOUS | Status: AC
  Administered 2015-09-01: 500 [IU] via INTRAVENOUS
  Filled 2015-09-01: qty 5

## 2015-09-01 NOTE — Progress Notes (Signed)
Tara Mcfarland presented for Portacath access and flush. Portacath located left chest wall accessed with  H 20 needle. Good blood return present. Portacath flushed with 41ml NS and 500U/7ml Heparin and needle removed intact. Procedure without incident. Patient tolerated procedure well.  Labs drawn as well

## 2015-09-01 NOTE — Patient Instructions (Signed)
[place] at [facility] Discharge Instructions  RECOMMENDATIONS MADE BY THE CONSULTANT AND ANY TEST RESULTS WILL BE SENT TO YOUR REFERRING PHYSICIAN.  Exam done and seen today by Dr. Whitney Muse Will make referral for PT for strengthening,  Labs in at next appointment for follow up Did a port flush with labs today. Return to see the doctor in 7-8 weeks Please call the clinic if you have any questions or concerns  Thank you for choosing [place] at [facility] to provide your oncology and hematology care.  To afford each patient quality time with our provider, please arrive at least 15 minutes before your scheduled appointment time.   Beginning January 23rd 2017 lab work for the [facility] will be done in the  Main lab at [facility] on 1st floor. If you have a lab appointment with the [facility] please come in thru the  Main Entrance and check in at the main information desk  You need to re-schedule your appointment should you arrive 10 or more minutes late.  We strive to give you quality time with our providers, and arriving late affects you and other patients whose appointments are after yours.  Also, if you no show three or more times for appointments you may be dismissed from the clinic at the providers discretion.     Again, thank you for choosing [facility].  Our hope is that these requests will decrease the amount of time that you wait before being seen by our physicians.       _____________________________________________________________  Should you have questions after your visit to [facility], please contact our office at [phone] between the hours of 8:30 a.m. and 4:30 p.m.  Voicemails left after 4:30 p.m. will not be returned until the following business day.  For prescription refill requests, have your pharmacy contact our office.         Resources For Cancer Patients and their  Caregivers ? American Cancer Society: Can assist with transportation, wigs, general needs, runs Look Good Feel Better.        224-698-3142 ? Cancer Care: Provides financial assistance, online support groups, medication/co-pay assistance.  1-800-813-HOPE 947-311-6439) ? [facility] Assists [place] cancer patients and their families through emotional , educational and financial support.  (541)839-3929 ? [place] DSS Where to apply for food stamps, Medicaid and utility assistance. (360) 467-4661 ? [facility]: Transportation to medical appointments. 667-277-0731 ? Social Security Administration: May apply for disability if have a Stage IV cancer. 601 375 1460 312-729-8042 ? [employer], Disability and Transit Services: Assists with nutrition, care and transit needs. [place] Support Programs: @10RELATIVEDAYS @ > Cancer Support Group  2nd Tuesday of the month 1pm-2pm, Journey Room  > Creative Journey  3rd Tuesday of the month 1130am-1pm, Journey Room  > Look Good Feel Better  1st Wednesday of the month 10am-12 noon, Journey Room (Call [place] to register [phone])

## 2015-09-02 ENCOUNTER — Encounter (HOSPITAL_COMMUNITY): Payer: Self-pay | Admitting: Hematology & Oncology

## 2015-09-03 ENCOUNTER — Encounter (HOSPITAL_COMMUNITY): Payer: Self-pay | Admitting: Hematology & Oncology
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
## 2015-09-03 NOTE — Telephone Encounter (Signed)
Called patient back to tell her to just use lotion on her feet, toes, fingers for her nails. Do not soak them or try to cut them off. She agreed.

## 2015-09-04 ENCOUNTER — Telehealth (HOSPITAL_COMMUNITY): Payer: Self-pay | Admitting: Hematology & Oncology

## 2015-09-04 DIAGNOSIS — M329 Systemic lupus erythematosus, unspecified: Secondary | ICD-10-CM | POA: Insufficient documentation

## 2015-09-04 DIAGNOSIS — M810 Age-related osteoporosis without current pathological fracture: Secondary | ICD-10-CM | POA: Insufficient documentation

## 2015-09-04 NOTE — Telephone Encounter (Signed)
FAXED REF TO [facility] [place], [place] [address] Phone: [phone] Fax: [phone]

## 2015-09-11 ENCOUNTER — Telehealth: Payer: Self-pay | Admitting: Oncology

## 2015-09-11 NOTE — Telephone Encounter (Addendum)
[place], RN from [facility] called and said Tara Mcfarland has a CT SIM at 7:50 am on Monday morning.  Per [place], patient has "panicked" during [facility] in the past and is asking for anti-anxiety medication.  Per Dr. Isidore Moos, have [place] call in Ativan 0.5 mg - Take 1-2 tablets by mouth 30 minutes before procedure.  Disp. 2 tablets.  [place] verified information and will call in to [facility]

## 2015-09-16 DIAGNOSIS — Z923 Personal history of irradiation: Secondary | ICD-10-CM

## 2015-09-16 HISTORY — DX: Personal history of irradiation: Z92.3

## 2015-10-27 ENCOUNTER — Encounter (HOSPITAL_COMMUNITY): Payer: Medicaid Other | Attending: Hematology & Oncology

## 2015-10-27 ENCOUNTER — Encounter (HOSPITAL_BASED_OUTPATIENT_CLINIC_OR_DEPARTMENT_OTHER): Payer: Medicaid Other | Admitting: Oncology

## 2015-10-27 ENCOUNTER — Encounter (HOSPITAL_COMMUNITY): Payer: Self-pay | Admitting: Oncology

## 2015-10-27 ENCOUNTER — Other Ambulatory Visit (HOSPITAL_COMMUNITY): Payer: Self-pay | Admitting: Pharmacist

## 2015-10-27 VITALS — BP 133/68 | HR 77 | Temp 98.6°F | Resp 18 | Wt 202.3 lb

## 2015-10-27 DIAGNOSIS — M549 Dorsalgia, unspecified: Secondary | ICD-10-CM

## 2015-10-27 DIAGNOSIS — Z0000 Encounter for general adult medical examination without abnormal findings: Secondary | ICD-10-CM

## 2015-10-27 DIAGNOSIS — C50911 Malignant neoplasm of unspecified site of right female breast: Secondary | ICD-10-CM | POA: Diagnosis present

## 2015-10-27 DIAGNOSIS — C50311 Malignant neoplasm of lower-inner quadrant of right female breast: Secondary | ICD-10-CM

## 2015-10-27 DIAGNOSIS — Z23 Encounter for immunization: Secondary | ICD-10-CM | POA: Diagnosis present

## 2015-10-27 DIAGNOSIS — Z95828 Presence of other vascular implants and grafts: Secondary | ICD-10-CM

## 2015-10-27 DIAGNOSIS — G8929 Other chronic pain: Secondary | ICD-10-CM

## 2015-10-27 LAB — COMPREHENSIVE METABOLIC PANEL
ALBUMIN: 4.3 g/dL (ref 3.5–5.0)
ALK PHOS: 53 U/L (ref 38–126)
ALT: 29 U/L (ref 14–54)
ANION GAP: 9 (ref 5–15)
AST: 28 U/L (ref 15–41)
BILIRUBIN TOTAL: 0.8 mg/dL (ref 0.3–1.2)
BUN: 13 mg/dL (ref 6–20)
CALCIUM: 9.5 mg/dL (ref 8.9–10.3)
CO2: 26 mmol/L (ref 22–32)
Chloride: 104 mmol/L (ref 101–111)
Creatinine, Ser: 0.85 mg/dL (ref 0.44–1.00)
GLUCOSE: 92 mg/dL (ref 65–99)
Potassium: 3.7 mmol/L (ref 3.5–5.1)
Sodium: 139 mmol/L (ref 135–145)
TOTAL PROTEIN: 7.3 g/dL (ref 6.5–8.1)

## 2015-10-27 LAB — CBC WITH DIFFERENTIAL/PLATELET
Basophils Absolute: 0 10*3/uL (ref 0.0–0.1)
Basophils Relative: 0 %
Eosinophils Absolute: 0.5 10*3/uL (ref 0.0–0.7)
Eosinophils Relative: 8 %
HEMATOCRIT: 39.9 % (ref 36.0–46.0)
HEMOGLOBIN: 13.6 g/dL (ref 12.0–15.0)
LYMPHS ABS: 1 10*3/uL (ref 0.7–4.0)
LYMPHS PCT: 16 %
MCH: 28.4 pg (ref 26.0–34.0)
MCHC: 34.1 g/dL (ref 30.0–36.0)
MCV: 83.3 fL (ref 78.0–100.0)
MONO ABS: 0.5 10*3/uL (ref 0.1–1.0)
MONOS PCT: 8 %
NEUTROS ABS: 4.2 10*3/uL (ref 1.7–7.7)
NEUTROS PCT: 68 %
Platelets: 226 10*3/uL (ref 150–400)
RBC: 4.79 MIL/uL (ref 3.87–5.11)
RDW: 13.1 % (ref 11.5–15.5)
WBC: 6.1 10*3/uL (ref 4.0–10.5)

## 2015-10-27 MED ORDER — INFLUENZA VAC RECOM HA QUAD PF 0.5 ML IM SOSY: 0.5000 mL | PREFILLED_SYRINGE | Freq: Once | INTRAMUSCULAR | 0 refills | Status: DC

## 2015-10-27 MED ORDER — INFLUENZA VAC RECOM HA QUAD PF 0.5 ML IM SOSY
0.5000 mL | PREFILLED_SYRINGE | INTRAMUSCULAR | Status: DC
  Filled 2015-10-27: qty 0.5

## 2015-10-27 MED ORDER — INFLUENZA VAC RECOM HA QUAD PF 0.5 ML IM SOSY
0.5000 mL | PREFILLED_SYRINGE | Freq: Once | INTRAMUSCULAR | Status: AC
  Administered 2015-10-27: 0.5 mL via INTRAMUSCULAR
  Filled 2015-10-27: qty 0.5

## 2015-10-27 MED ORDER — INFLUENZA VAC SPLIT QUAD 0.5 ML IM SUSY
0.5000 mL | PREFILLED_SYRINGE | Freq: Once | INTRAMUSCULAR | Status: DC
Start: 2015-10-27 — End: 2015-10-27

## 2015-10-27 MED ORDER — HEPARIN SOD (PORK) LOCK FLUSH 100 UNIT/ML IV SOLN
500.0000 [IU] | Freq: Once | INTRAVENOUS | Status: AC
  Administered 2015-10-27: 500 [IU] via INTRAVENOUS

## 2015-10-27 MED ORDER — SODIUM CHLORIDE 0.9% FLUSH
10.0000 mL | INTRAVENOUS | Status: DC | PRN
  Administered 2015-10-27: 10 mL via INTRAVENOUS
  Filled 2015-10-27: qty 10

## 2015-10-27 NOTE — Patient Instructions (Signed)
Tara Mcfarland at [facility] Discharge Instructions  RECOMMENDATIONS MADE BY THE CONSULTANT AND ANY TEST RESULTS WILL BE SENT TO YOUR REFERRING PHYSICIAN.  You were seen by [name] today. Port Flush every 6 weeks Follow up and Labs in 3 months Handicap form given   Thank you for choosing [place] at [facility] to provide your oncology and hematology care.  To afford each patient quality time with our provider, please arrive at least 15 minutes before your scheduled appointment time.   Beginning January 23rd 2017 lab work for the [facility] will be done in the  Main lab at [facility] on 1st floor. If you have a lab appointment with the [facility] please come in thru the  Main Entrance and check in at the main information desk  You need to re-schedule your appointment should you arrive 10 or more minutes late.  We strive to give you quality time with our providers, and arriving late affects you and other patients whose appointments are after yours.  Also, if you no show three or more times for appointments you may be dismissed from the clinic at the providers discretion.     Again, thank you for choosing [facility]  Our hope is that these requests will decrease the amount of time that you wait before being seen by our physicians.       _____________________________________________________________  Should you have questions after your visit to [facility] please contact our office at [phone] between the hours of 8:30 a.m. and 4:30 p.m.  Voicemails left after 4:30 p.m. will not be returned until the following business day.  For prescription refill requests, have your pharmacy contact our office.         Resources For Cancer Patients and their Caregivers ? American Cancer Society: Can assist with transportation, wigs, general needs, runs Look Good Feel Better.        ([phone]) [phone] ? Cancer Care: Provides  financial assistance, online support groups, medication/co-pay assistance.  1-800-813-HOPE 3478489575) ? [place] Assists [place] cancer patients and their families through emotional , educational and financial support.  (615)823-1139 ? [place] DSS Where to apply for food stamps, Medicaid and utility assistance. (307)318-2185 ? RCATS: Transportation to medical appointments. (854) 602-7495 ? Social Security Administration: May apply for disability if have a Stage IV cancer. 873-587-1603 443-517-6490 ? [employer], Disability and Transit Services: Assists with nutrition, care and transit needs. [place] Support Programs: @10RELATIVEDAYS @ > Cancer Support Group  2nd Tuesday of the month 1pm-2pm, Journey Room  > Creative Journey  3rd Tuesday of the month 1130am-1pm, Journey Room  > Look Good Feel Better  1st Wednesday of the month 10am-12 noon, Journey Room (Call [place] to register [phone])

## 2015-10-27 NOTE — Patient Instructions (Signed)
[place] at [facility] Discharge Instructions  RECOMMENDATIONS MADE BY THE CONSULTANT AND ANY TEST RESULTS WILL BE SENT TO YOUR REFERRING PHYSICIAN.  You were given a flu shot and you had your port flushed and labs drawn. Will call you with lab results.  Return as scheduled.   Thank you for choosing [place] at [facility] to provide your oncology and hematology care.  To afford each patient quality time with our provider, please arrive at least 15 minutes before your scheduled appointment time.   Beginning January 23rd 2017 lab work for the [facility] will be done in the  Main lab at [facility] on 1st floor. If you have a lab appointment with the [facility] please come in thru the  Main Entrance and check in at the main information desk  You need to re-schedule your appointment should you arrive 10 or more minutes late.  We strive to give you quality time with our providers, and arriving late affects you and other patients whose appointments are after yours.  Also, if you no show three or more times for appointments you may be dismissed from the clinic at the providers discretion.     Again, thank you for choosing [facility].  Our hope is that these requests will decrease the amount of time that you wait before being seen by our physicians.       _____________________________________________________________  Should you have questions after your visit to [facility], please contact our office at [phone] between the hours of 8:30 a.m. and 4:30 p.m.  Voicemails left after 4:30 p.m. will not be returned until the following business day.  For prescription refill requests, have your pharmacy contact our office.         Resources For Cancer Patients and their Caregivers ? American Cancer Society: Can assist with transportation, wigs, general needs, runs Look Good Feel Better.         6613588516 ? Cancer Care: Provides financial assistance, online support groups, medication/co-pay assistance.  1-800-813-HOPE 570-250-4800) ? [place] Assists [place] cancer patients and their families through emotional , educational and financial support.  (442) 187-6821 ? [place] DSS Where to apply for food stamps, Medicaid and utility assistance. 5592756854 ? RCATS: Transportation to medical appointments. 7601314034 ? Social Security Administration: May apply for disability if have a Stage IV cancer. 431-448-1538 [phone] ? [employer], Disability and Transit Services: Assists with nutrition, care and transit needs. [place] Support Programs: @10RELATIVEDAYS @ > Cancer Support Group  2nd Tuesday of the month 1pm-2pm, Journey Room  > Creative Journey  3rd Tuesday of the month 1130am-1pm, Journey Room  > Look Good Feel Better  1st Wednesday of the month 10am-12 noon, Journey Room (Call [place] to register [phone])

## 2015-10-27 NOTE — Assessment & Plan Note (Addendum)
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 Barry Dienes on 01/21/2015 with 0/4 lymph nodes and no LVI.  Treated at [facility] with AC x 4 followed by 9/12 cycles of weekly Taxol (02/27/2015- 07/08/2015) which was discontinued early due to skin toxicity.  NOW UNDERGOING XRT.  Oncology history is updated.  Staging in [facility] problem list is completed.  Labs today: CBC diff, CMET.  I personally reviewed and went over laboratory results with the patient.  The results are noted within this dictation.  Labs in 3 months: CBC with differential, complete metabolic panel.   She continues with low chronic back pain.  She notes that this is at baseline.  Her back pain is making long distance walking difficult.  As a result, she requests a handicap placard.  Form is completed today for handicap placard (temporary).  She notes recent intolerance to eggs. I suspect an IgE mediated reaction given the patient's complaints. She notes diarrhea and bloating following egg consumption (approximately 15 minutes following consumption).  She is agreeable to influenza vaccine but is concerned about the contraindication regarding to egg intolerance. As a result, FluBlock will be administered today.  We reviewed the NCCN guidelines pertaining to surveillance of triple negative breast cancer. She is encouraged to undergo repeat mammogram as planned in October 2017. Order is placed for digital 3 mammogram.  Port flush today with laboratory work. Port flushes every 6 weeks.  Return in 3 months for follow-up.

## 2015-10-27 NOTE — Progress Notes (Signed)
Tara Mcfarland presented for Portacath access and flush. Portacath located left chest wall accessed with  H 20 needle. Good blood return present. Portacath flushed with 69ml NS and 500U/14ml Heparin and needle removed intact. Procedure without incident. Patient tolerated procedure well. Labs drawn and sent to lab.  Pt also given flu shot in left deltoid. Pt tolerated well. Pt stable and discharged home ambulatory.  Pt to return as scheduled.

## 2015-10-27 NOTE — Progress Notes (Signed)
Tara Fairy, PA-C [address] [place] [address]  Preventative health care  Malignant neoplasm of lower-inner quadrant of right female breast ([place]) - Plan: CBC with Differential, Comprehensive metabolic panel, MM DIAG BREAST TOMO BILATERAL, DISCONTINUED: Influenza vac split quadrivalent PF (FLUARIX) injection 0.5 mL  Cancer of right breast, stage 2 (HCC)  CURRENT THERAPY: Completing radiation therapy followed by surveillance per NCCN guidelines  INTERVAL HISTORY: Tara Mcfarland 55 y.o. 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 Barry Dienes on 01/21/2015 with 0/4 lymph nodes and no LVI.  Treated at [facility] with AC x 4 followed by 9/12 cycles of weekly Taxol (02/27/2015- 07/08/2015) which was discontinued early due to skin toxicity.  NOW UNDERGOING XRT.    Cancer of right breast, stage 2 ([place])   01/21/2015 Surgery    Right breast lumpectomy and four axillary sentinel lymph nodes with Dr. Barry Dienes. R breast cancer, lower inner quadrant       01/26/2015 Pathology Results    HER 2 -, ER -, PR - Ki67 90% Invasive Grade III ductal carcinoma 2.1 cm high grade DCIS, 0/4 LN, no LVI, no lobular neoplasia pT2 pN0      02/27/2015 - 07/08/2015 Chemotherapy    AC x 4, Weekly Taxol x 9      07/08/2015 Adverse Reaction    Last 3 weekly cycles of taxol discontinued secondary to skin toxitcity. All therapy received at [facility]      She reports chronic low back pain. She notes that this is at baseline. She has been referred for physical therapy that Medicaid will not pay for physical therapy. However, they will pay for occupational therapy which is of no benefit at this point in time. Due to this back pain, she is having a difficult time walking for long distances. As a result, she requests a handicap placard.  She notes some new intolerance to eggs. She  notes that since completing chemotherapy, she experiences abdominal bloating and diarrhea following egg consumption. It occurs approximately 15 minutes post-egg consumption. She is agreeable to influenza vaccine today but is concerned about her issues with headaches.  Review of Systems  Constitutional: Negative.  Negative for chills, fever and weight loss.  HENT: Negative.   Eyes: Negative.  Negative for blurred vision and double vision.  Respiratory: Negative.  Negative for cough.   Cardiovascular: Negative.  Negative for chest pain.  Gastrointestinal: Positive for abdominal pain, diarrhea and nausea.  Genitourinary: Negative.   Musculoskeletal: Positive for back pain.  Skin: Negative.   Neurological: Negative.  Negative for weakness and headaches.  Endo/Heme/Allergies: Negative.   Psychiatric/Behavioral: Negative.     Past Medical History:  Diagnosis Date  . Anxiety   . Cancer [facility])    right breast  . Cancer of right breast, stage 2 ([place]) 09/01/2015  . GERD (gastroesophageal reflux disease)   . Graves disease   . Headache   . Hypothyroidism   . PONV (postoperative nausea and vomiting)   . Seizures ([place])     Past Surgical History:  Procedure Laterality Date  . BREAST LUMPECTOMY WITH RADIOACTIVE SEED AND SENTINEL LYMPH NODE BIOPSY Right 01/21/2015   Procedure: RIGHT BREAST LUMPECTOMY WITH RADIOACTIVE SEED AND RIGHT SENTINEL LYMPH NODE BIOPSY;  Surgeon: Stark Klein, MD;  Location: [place];  Service: General;  Laterality: Right;  . CESAREAN  SECTION    . PORTACATH PLACEMENT Left 01/21/2015   Procedure: INSERTION PORT-A-CATH;  Surgeon: Stark Klein, MD;  Location: [place];  Service: General;  Laterality: Left;  . TUBAL LIGATION      History reviewed. No pertinent family history.  Social History   Social History  . Marital status: Married    Spouse name: Tara Mcfarland  . Number of children: Tara Mcfarland  . Years of education: Tara Mcfarland   Social History Main  Topics  . Smoking status: Former Research scientist (life sciences)  . Smokeless tobacco: Former Systems developer  . Alcohol use Yes     Comment: social  . Drug use: No  . Sexual activity: Yes    Birth control/ protection: None   Other Topics Concern  . None   Social History Narrative  . None     PHYSICAL EXAMINATION  ECOG PERFORMANCE STATUS: 1 - Symptomatic but completely ambulatory  Vitals:   10/27/15 1400  BP: 133/68  Pulse: 77  Resp: 18  Temp: 98.6 F (37 C)    GENERAL:alert, no distress, well nourished, well developed, comfortable, cooperative, obese, smiling and unaccompanied SKIN: skin color, texture, turgor are normal, no rashes or significant lesions HEAD: Normocephalic, No masses, lesions, tenderness or abnormalities EYES: normal, EOMI, Conjunctiva are pink and non-injected EARS: External ears normal OROPHARYNX:lips, buccal mucosa, and tongue normal and mucous membranes are moist  NECK: supple, no adenopathy, trachea midline LYMPH:  no palpable lymphadenopathy, no hepatosplenomegaly BREAST:not examined LUNGS: clear to auscultation and percussion HEART: regular rate & rhythm, no murmurs, no gallops, S1 normal and S2 normal ABDOMEN:abdomen soft, non-tender, obese, normal bowel sounds and no masses or organomegaly BACK: Back symmetric, no curvature. EXTREMITIES:less then 2 second capillary refill, no joint deformities, effusion, or inflammation, no skin discoloration, no cyanosis  NEURO: alert & oriented x 3 with fluent speech, no focal motor/sensory deficits, gait normal    LABORATORY DATA: CBC    Component Value Date/Time   WBC 6.1 10/27/2015 1603   RBC 4.79 10/27/2015 1603   HGB 13.6 10/27/2015 1603   HCT 39.9 10/27/2015 1603   PLT 226 10/27/2015 1603   MCV 83.3 10/27/2015 1603   MCH 28.4 10/27/2015 1603   MCHC 34.1 10/27/2015 1603   RDW 13.1 10/27/2015 1603   LYMPHSABS 1.0 10/27/2015 1603   MONOABS 0.5 10/27/2015 1603   EOSABS 0.5 10/27/2015 1603   BASOSABS 0.0 10/27/2015 1603       Chemistry      Component Value Date/Time   NA 139 10/27/2015 1603   K 3.7 10/27/2015 1603   CL 104 10/27/2015 1603   CO2 26 10/27/2015 1603   BUN 13 10/27/2015 1603   CREATININE 0.85 10/27/2015 1603      Component Value Date/Time   CALCIUM 9.5 10/27/2015 1603   ALKPHOS 53 10/27/2015 1603   AST 28 10/27/2015 1603   ALT 29 10/27/2015 1603   BILITOT 0.8 10/27/2015 1603        PENDING LABS:   RADIOGRAPHIC STUDIES:  No results found.   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 Barry Dienes on 01/21/2015 with 0/4 lymph nodes and no LVI.  Treated at [facility] with AC x 4 followed by 9/12 cycles of weekly Taxol (02/27/2015- 07/08/2015) which was discontinued early due to skin toxicity.  NOW UNDERGOING XRT.  Oncology history is  updated.  Staging in [facility] problem list is completed.  Labs today: CBC diff, CMET.  I personally reviewed and went over laboratory results with the patient.  The results are noted within this dictation.  Labs in 3 months: CBC with differential, complete metabolic panel.   She continues with low chronic back pain.  She notes that this is at baseline.  Her back pain is making long distance walking difficult.  As a result, she requests a handicap placard.  Form is completed today for handicap placard (temporary).  She notes recent intolerance to eggs. I suspect an IgE mediated reaction given the patient's complaints. She notes diarrhea and bloating following egg consumption (approximately 15 minutes following consumption).  She is agreeable to influenza vaccine but is concerned about the contraindication regarding to egg intolerance. As a result, FluBlock will be administered today.  We reviewed the NCCN guidelines pertaining to surveillance of triple negative breast cancer. She is  encouraged to undergo repeat mammogram as planned in October 2017. Order is placed for digital 3 mammogram.  Port flush today with laboratory work. Port flushes every 6 weeks.  Return in 3 months for follow-up.    ORDERS PLACED FOR THIS ENCOUNTER: Orders Placed This Encounter  Procedures  . MM DIAG BREAST TOMO BILATERAL  . CBC with Differential  . Comprehensive metabolic panel    MEDICATIONS PRESCRIBED THIS ENCOUNTER: Meds ordered this encounter  Medications  . DISCONTD: Influenza vac split quadrivalent PF (FLUARIX) injection 0.5 mL  . DISCONTD: influenza vac recom quadrivalent (FLUBLOK) injection 0.5 mL  . influenza vac recom quadrivalent (FLUBLOK) injection 0.5 mL    THERAPY PLAN:  NCCN guidelines recommends the following surveillance for invasive breast cancer (2.2017):  A. History and Physical exam 1-4 times per year as clinically appropriate for 5 years, then annually.  B. Periodic screening for changes in family history and referral to genetics counseling as indicated  C. Educate, monitor, and refer to lymphedema management.  D. Mammography every 12 months  E. Routine imaging of reconstructed breast is not indicated.  F. In the absence of clinical signs and symptoms suggestive of recurrent disease, there is no indication for laboratory or imaging studies for metastases screening.  G. Women on Tamoxifen: annual gynecologic assessment every 12 months if uterus is present.  H. Women on aromatase inhibitor or who experience ovarian failure secondary to treatment should have monitoring of bone health with a bone mineral density determination at baseline and periodically thereafter.  I. Assess and encourage adherence to adjuvant endocrine therapy.  J. Evidence suggests that active lifestyle, healthy diet, limited alcohol intake, and achieving and maintaining an ideal body weight (20-25 BMI) may lead to optimal breast cancer outcomes.   All questions were answered. The patient  knows to call the clinic with any problems, questions or concerns. We can certainly see the patient much sooner if necessary.  Patient and plan discussed with Dr. Ancil Linsey and she is in agreement with the aforementioned.   This note is electronically signed by: Doy Mince 10/27/2015 10:29 PM

## 2015-11-20 ENCOUNTER — Telehealth (HOSPITAL_COMMUNITY): Payer: Self-pay | Admitting: *Deleted

## 2015-11-23 ENCOUNTER — Other Ambulatory Visit (HOSPITAL_COMMUNITY): Payer: Self-pay | Admitting: Oncology

## 2015-11-23 DIAGNOSIS — Z9889 Other specified postprocedural states: Secondary | ICD-10-CM

## 2015-11-23 NOTE — Telephone Encounter (Signed)
Talked with patient, she states she was around a person with MRSA in her stool over a week ago. Verified that since patient did practice good handwashing and is not undergoing therapy at this time she should be okay.

## 2015-12-01 ENCOUNTER — Ambulatory Visit (HOSPITAL_COMMUNITY)
Admission: RE | Admit: 2015-12-01 | Discharge: 2015-12-01 | Disposition: A | Discharge status: Home / Self Care | Payer: Medicaid Other | Source: Ambulatory Visit | Attending: Oncology | Admitting: Oncology

## 2015-12-01 DIAGNOSIS — C50311 Malignant neoplasm of lower-inner quadrant of right female breast: Secondary | ICD-10-CM | POA: Diagnosis present

## 2015-12-17 ENCOUNTER — Encounter (HOSPITAL_COMMUNITY): Payer: Medicaid Other | Attending: Hematology & Oncology

## 2015-12-17 VITALS — BP 112/77 | HR 64 | Temp 98.5°F | Resp 18

## 2015-12-17 DIAGNOSIS — Z95828 Presence of other vascular implants and grafts: Secondary | ICD-10-CM

## 2015-12-17 DIAGNOSIS — Z452 Encounter for adjustment and management of vascular access device: Secondary | ICD-10-CM

## 2015-12-17 DIAGNOSIS — C50311 Malignant neoplasm of lower-inner quadrant of right female breast: Secondary | ICD-10-CM | POA: Diagnosis not present

## 2015-12-17 DIAGNOSIS — C50911 Malignant neoplasm of unspecified site of right female breast: Secondary | ICD-10-CM | POA: Insufficient documentation

## 2015-12-17 MED ORDER — HEPARIN SOD (PORK) LOCK FLUSH 100 UNIT/ML IV SOLN
500.0000 [IU] | Freq: Once | INTRAVENOUS | Status: AC
  Administered 2015-12-17: 500 [IU] via INTRAVENOUS

## 2015-12-17 MED ORDER — SODIUM CHLORIDE 0.9% FLUSH
10.0000 mL | INTRAVENOUS | Status: DC | PRN
  Administered 2015-12-17: 10 mL via INTRAVENOUS
  Filled 2015-12-17: qty 10

## 2015-12-17 MED ORDER — HEPARIN SOD (PORK) LOCK FLUSH 100 UNIT/ML IV SOLN
INTRAVENOUS | Status: AC
  Filled 2015-12-17: qty 5

## 2015-12-17 NOTE — Progress Notes (Signed)
Tara Mcfarland tolerated port flush well without complaints or incident. Port flushed with 10 ml NS and 5 ml Heparin easily per protocol. VSS Pt discharged self ambulatory in satisfactory condition

## 2015-12-17 NOTE — Patient Instructions (Signed)
[facility] at [facility] Discharge Instructions  RECOMMENDATIONS MADE BY THE CONSULTANT AND ANY TEST RESULTS WILL BE SENT TO YOUR REFERRING PHYSICIAN.  Port flushed per protocol today. Follow-up as scheduled. Call clinic for any questions or concerns  Thank you for choosing [facility] at [facility] to provide your oncology and hematology care.  To afford each patient quality time with our provider, please arrive at least 15 minutes before your scheduled appointment time.   Beginning January 23rd 2017 lab work for the Cancer Center will be done in the  Main lab at [facility] on 1st floor. If you have a lab appointment with the Cancer Center please come in thru the  Main Entrance and check in at the main information desk  You need to re-schedule your appointment should you arrive 10 or more minutes late.  We strive to give you quality time with our providers, and arriving late affects you and other patients whose appointments are after yours.  Also, if you no show three or more times for appointments you may be dismissed from the clinic at the providers discretion.     Again, thank you for choosing [facility].  Our hope is that these requests will decrease the amount of time that you wait before being seen by our physicians.       _____________________________________________________________  Should you have questions after your visit to  Cancer Center, please contact our office at [phone] between the hours of 8:30 a.m. and 4:30 p.m.  Voicemails left after 4:30 p.m. will not be returned until the following business day.  For prescription refill requests, have your pharmacy contact our office.         Resources For Cancer Patients and their Caregivers ? American Cancer Society: Can assist with transportation, wigs, general needs, runs Look Good Feel Better.        1-888-227-6333 ? Cancer Care: Provides financial  assistance, online support groups, medication/co-pay assistance.  1-800-813-HOPE (4673) ? [facility] Assists [place] cancer patients and their families through emotional , educational and financial support.  336-427-4357 ? [place] DSS Where to apply for food stamps, Medicaid and utility assistance. 336-342-1394 ? RCATS: Transportation to medical appointments. 336-347-2287 ? Social Security Administration: May apply for disability if have a Stage IV cancer. 336-342-7796 1-800-772-1213 ? [place] Aging, Disability and Transit Services: Assists with nutrition, care and transit needs. 336-349-2343  Cancer Center Support Programs: @10RELATIVEDAYS@ > Cancer Support Group  2nd Tuesday of the month 1pm-2pm, Journey Room  > Creative Journey  3rd Tuesday of the month 1130am-1pm, Journey Room  > Look Good Feel Better  1st Wednesday of the month 10am-12 noon, Journey Room (Call American Cancer [employer] to register [phone])    

## 2016-01-05 ENCOUNTER — Encounter (HOSPITAL_COMMUNITY): Payer: Self-pay

## 2016-01-26 ENCOUNTER — Encounter (HOSPITAL_BASED_OUTPATIENT_CLINIC_OR_DEPARTMENT_OTHER): Payer: Medicaid Other

## 2016-01-26 ENCOUNTER — Encounter (HOSPITAL_COMMUNITY): Payer: Medicaid Other | Attending: Hematology & Oncology | Admitting: Oncology

## 2016-01-26 ENCOUNTER — Encounter (HOSPITAL_COMMUNITY): Payer: Self-pay | Admitting: Oncology

## 2016-01-26 VITALS — BP 125/58 | HR 94 | Resp 18 | Ht 63.5 in | Wt 198.0 lb

## 2016-01-26 DIAGNOSIS — Z171 Estrogen receptor negative status [ER-]: Secondary | ICD-10-CM

## 2016-01-26 DIAGNOSIS — M549 Dorsalgia, unspecified: Secondary | ICD-10-CM

## 2016-01-26 DIAGNOSIS — C50311 Malignant neoplasm of lower-inner quadrant of right female breast: Secondary | ICD-10-CM | POA: Diagnosis present

## 2016-01-26 DIAGNOSIS — C50911 Malignant neoplasm of unspecified site of right female breast: Secondary | ICD-10-CM | POA: Diagnosis present

## 2016-01-26 DIAGNOSIS — I890 Lymphedema, not elsewhere classified: Secondary | ICD-10-CM

## 2016-01-26 DIAGNOSIS — M810 Age-related osteoporosis without current pathological fracture: Secondary | ICD-10-CM

## 2016-01-26 DIAGNOSIS — Z95828 Presence of other vascular implants and grafts: Secondary | ICD-10-CM

## 2016-01-26 LAB — COMPREHENSIVE METABOLIC PANEL
ALBUMIN: 4.1 g/dL (ref 3.5–5.0)
ALT: 27 U/L (ref 14–54)
ANION GAP: 8 (ref 5–15)
AST: 29 U/L (ref 15–41)
Alkaline Phosphatase: 60 U/L (ref 38–126)
BUN: 9 mg/dL (ref 6–20)
CO2: 25 mmol/L (ref 22–32)
Calcium: 9.1 mg/dL (ref 8.9–10.3)
Chloride: 104 mmol/L (ref 101–111)
Creatinine, Ser: 0.81 mg/dL (ref 0.44–1.00)
GFR calc Af Amer: >60 mL/min (ref >60)
GFR calc non Af Amer: >60 mL/min (ref >60)
GLUCOSE: 99 mg/dL (ref 65–99)
POTASSIUM: 3.4 mmol/L — AB (ref 3.5–5.1)
SODIUM: 137 mmol/L (ref 135–145)
Total Bilirubin: 1.4 mg/dL — ABNORMAL HIGH (ref 0.3–1.2)
Total Protein: 7 g/dL (ref 6.5–8.1)

## 2016-01-26 LAB — CBC WITH DIFFERENTIAL/PLATELET
Basophils Absolute: 0 10*3/uL (ref 0.0–0.1)
Basophils Relative: 0 %
Eosinophils Absolute: 0.4 10*3/uL (ref 0.0–0.7)
Eosinophils Relative: 6 %
HCT: 40 % (ref 36.0–46.0)
Hemoglobin: 13.6 g/dL (ref 12.0–15.0)
Lymphocytes Relative: 26 %
Lymphs Abs: 1.6 10*3/uL (ref 0.7–4.0)
MCH: 29.1 pg (ref 26.0–34.0)
MCHC: 34 g/dL (ref 30.0–36.0)
MCV: 85.7 fL (ref 78.0–100.0)
Monocytes Absolute: 0.3 10*3/uL (ref 0.1–1.0)
Monocytes Relative: 5 %
Neutro Abs: 3.9 10*3/uL (ref 1.7–7.7)
Neutrophils Relative %: 63 %
Platelets: 223 10*3/uL (ref 150–400)
RBC: 4.67 MIL/uL (ref 3.87–5.11)
RDW: 13.3 % (ref 11.5–15.5)
WBC: 6.2 10*3/uL (ref 4.0–10.5)

## 2016-01-26 MED ORDER — HEPARIN SOD (PORK) LOCK FLUSH 100 UNIT/ML IV SOLN
500.0000 [IU] | Freq: Once | INTRAVENOUS | Status: AC
  Administered 2016-01-26: 500 [IU] via INTRAVENOUS
  Filled 2016-01-26: qty 5

## 2016-01-26 MED ORDER — HEPARIN SOD (PORK) LOCK FLUSH 100 UNIT/ML IV SOLN: 500.0000 [IU] | Freq: Once | INTRAVENOUS | Status: DC

## 2016-01-26 MED ORDER — SODIUM CHLORIDE 0.9% FLUSH: 10.0000 mL | INTRAVENOUS | Status: DC | PRN

## 2016-01-26 MED ORDER — SODIUM CHLORIDE 0.9% FLUSH
20.0000 mL | INTRAVENOUS | Status: DC | PRN
  Administered 2016-01-26: 20 mL via INTRAVENOUS
  Filled 2016-01-26: qty 20

## 2016-01-26 NOTE — Assessment & Plan Note (Addendum)
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 Barry Dienes on 01/21/2015 with 0/4 lymph nodes and no LVI.  Treated at [facility] with AC x 4 followed by 9/12 cycles of weekly Taxol (02/27/2015- 07/08/2015) which was discontinued early due to skin toxicity.  Now S/P XRT.  Oncology history is updated.  Labs today: CBC diff, CMET.  I personally reviewed and went over laboratory results with the patient.  The results are noted within this dictation.  I personally reviewed and went over radiographic studies with the patient.  The results are noted within this dictation.  Mammogram from October 2017 is reviewed, BIRADS 2.  She will be due for repeat mammogram in October 2018.  Labs in 3 months: CBC diff, CMET.  She has right upper extremity lymphedema and ache.  Recently started lymphedema management with pneumatic compression sleeve device.  She reports GERD symptoms that OTC Pepcid (or similar) is not helping with.  I will give samples of Nexium.  If effective, she will call us and I will prescribe Nexium.  She reports getting Prolia at [facility] for osteoporosis.  I do not have this documented at this time.  She reports that her last bone density was > 2 years ago.  Order placed for bone density.  If osteoporotic, will build supportive therapy plan and anticipate starting Prolia in the near future.  She continues with low chronic back pain.  She notes that this is at baseline.  Her back pain is making long distance walking difficult.  She uses Aleve and Ultram for this pain (long-term/chronically).  I will ask staff to ascertain the patient's start and end date of XRT for documentation purposes.  She notes recent intolerance to eggs. I suspect an IgE mediated reaction given the patient's complaints. She notes diarrhea and bloating following egg consumption (approximately 15 minutes following consumption).  Port flushes  every 6 weeks.  Return in 3 months for follow-up.

## 2016-01-26 NOTE — Patient Instructions (Signed)
[facility] at [facility] Discharge Instructions  RECOMMENDATIONS MADE BY THE CONSULTANT AND ANY TEST RESULTS WILL BE SENT TO YOUR REFERRING PHYSICIAN.  Labs drawn from port today then port flushed per protocol. Follow-up as scheduled. Call clinic for any questions or concerns  Thank you for choosing [facility] at [facility] to provide your oncology and hematology care.  To afford each patient quality time with our provider, please arrive at least 15 minutes before your scheduled appointment time.   Beginning January 23rd 2017 lab work for the Cancer Center will be done in the  Main lab at [place] on 1st floor. If you have a lab appointment with the Cancer Center please come in thru the  Main Entrance and check in at the main information desk  You need to re-schedule your appointment should you arrive 10 or more minutes late.  We strive to give you quality time with our providers, and arriving late affects you and other patients whose appointments are after yours.  Also, if you no show three or more times for appointments you may be dismissed from the clinic at the providers discretion.     Again, thank you for choosing [facility].  Our hope is that these requests will decrease the amount of time that you wait before being seen by our physicians.       _____________________________________________________________  Should you have questions after your visit to [facility], please contact our office at [phone] between the hours of 8:30 a.m. and 4:30 p.m.  Voicemails left after 4:30 p.m. will not be returned until the following business day.  For prescription refill requests, have your pharmacy contact our office.         Resources For Cancer Patients and their Caregivers ? American Cancer Society: Can assist with transportation, wigs, general needs, runs Look Good Feel Better.        1-888-227-6333 ? Cancer  Care: Provides financial assistance, online support groups, medication/co-pay assistance.  1-800-813-HOPE (4673) ? [facility] Assists [place] cancer patients and their families through emotional , educational and financial support.  336-427-4357 ? [place] DSS Where to apply for food stamps, Medicaid and utility assistance. 336-342-1394 ? RCATS: Transportation to medical appointments. 336-347-2287 ? Social Security Administration: May apply for disability if have a Stage IV cancer. 336-342-7796 1-800-772-1213 ? [place] Aging, Disability and Transit Services: Assists with nutrition, care and transit needs. 336-349-2343  Cancer Center Support Programs: @10RELATIVEDAYS@ > Cancer Support Group  2nd Tuesday of the month 1pm-2pm, Journey Room  > Creative Journey  3rd Tuesday of the month 1130am-1pm, Journey Room  > Look Good Feel Better  1st Wednesday of the month 10am-12 noon, Journey Room (Call American Cancer Society to register 1-800-395-5775)   

## 2016-01-26 NOTE — Patient Instructions (Signed)
[place] at [facility] Discharge Instructions  RECOMMENDATIONS MADE BY THE CONSULTANT AND ANY TEST RESULTS WILL BE SENT TO YOUR REFERRING PHYSICIAN.  You were seen today by Kirby Crigler PA-C.  You need a bone density scan soon. Nexium samples given. Return every 6 weeks for port flushes. Labs today, will call with results. Return in 3 months for labs and follow up.   Thank you for choosing [place] at [facility] to provide your oncology and hematology care.  To afford each patient quality time with our provider, please arrive at least 15 minutes before your scheduled appointment time.   Beginning January 23rd 2017 lab work for the [facility] will be done in the  Main lab at [facility] on 1st floor. If you have a lab appointment with the [facility] please come in thru the  Main Entrance and check in at the main information desk  You need to re-schedule your appointment should you arrive 10 or more minutes late.  We strive to give you quality time with our providers, and arriving late affects you and other patients whose appointments are after yours.  Also, if you no show three or more times for appointments you may be dismissed from the clinic at the providers discretion.     Again, thank you for choosing [facility].  Our hope is that these requests will decrease the amount of time that you wait before being seen by our physicians.       _____________________________________________________________  Should you have questions after your visit to [place], please contact our office at [phone] between the hours of 8:30 a.m. and 4:30 p.m.  Voicemails left after 4:30 p.m. will not be returned until the following business day.  For prescription refill requests, have your pharmacy contact our office.         Resources For Cancer Patients and their Caregivers ? American Cancer Society: Can assist  with transportation, wigs, general needs, runs Look Good Feel Better.        310 343 7187 ? Cancer Care: Provides financial assistance, online support groups, medication/co-pay assistance.  1-800-813-HOPE (916)884-4521) ? [facility] Assists [place] cancer patients and their families through emotional , educational and financial support.  (815)653-7259 ? [place] DSS Where to apply for food stamps, Medicaid and utility assistance. (279) 877-1928 ? RCATS: Transportation to medical appointments. 323-773-1202 ? Social Security Administration: May apply for disability if have a Stage IV cancer. 641-311-8631 256-167-7843 ? [employer], Disability and Transit Services: Assists with nutrition, care and transit needs. [place] Support Programs: @10RELATIVEDAYS @ > Cancer Support Group  2nd Tuesday of the month 1pm-2pm, Journey Room  > Creative Journey  3rd Tuesday of the month 1130am-1pm, Journey Room  > Look Good Feel Better  1st Wednesday of the month 10am-12 noon, Journey Room (Call [facility] to register [phone])

## 2016-01-26 NOTE — Progress Notes (Signed)
Tara Fairy, PA-C [address] [place] [address]  Cancer of right breast, stage 2 ([place]) - Plan: MM DIAG BREAST TOMO BILATERAL, DG Bone Density, CBC with Differential, Comprehensive metabolic panel  Malignant neoplasm of lower-inner quadrant of right female breast ([place]) - Plan: CBC with Differential, Comprehensive metabolic panel  CURRENT THERAPY: Completing radiation therapy followed by surveillance per NCCN guidelines  INTERVAL HISTORY: Tara Mcfarland 55 y.o. 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 Barry Dienes on 01/21/2015 with 0/4 lymph nodes and no LVI.  Treated at [facility] with AC x 4 followed by 9/12 cycles of weekly Taxol (02/27/2015- 07/08/2015) which was discontinued early due to skin toxicity.  S/P XRT.    Cancer of right breast, stage 2 ([place])   01/21/2015 Surgery    Right breast lumpectomy and four axillary sentinel lymph nodes with Dr. Barry Dienes. R breast cancer, lower inner quadrant       01/26/2015 Pathology Results    HER 2 -, ER -, PR - Ki67 90% Invasive Grade III ductal carcinoma 2.1 cm high grade DCIS, 0/4 LN, no LVI, no lobular neoplasia pT2 pN0      02/27/2015 - 07/08/2015 Chemotherapy    AC x 4, Weekly Taxol x 9      07/08/2015 Adverse Reaction    Last 3 weekly cycles of taxol discontinued secondary to skin toxitcity. All therapy received at [facility]      She is S/P XRT.  She reports skin toxicity secondary to XRT.  She is recovering from that.  She notes a right arm ache.  She recently received pneumatic compression sleeve that she is using at home.  She notes that it is very comforting and relaxing.    She reports memory issues and forgetfulness.  She is advised to write down things to remember to help with this.  She reports indigestion and reflux.  She is using OTC Pepcid.  Review of Systems  Constitutional:  Negative.  Negative for chills, fever and weight loss.  HENT: Negative.   Eyes: Negative.  Negative for blurred vision and double vision.  Respiratory: Negative.  Negative for cough.   Cardiovascular: Negative.  Negative for chest pain.  Gastrointestinal: Positive for heartburn. Negative for abdominal pain, diarrhea, nausea and vomiting.  Genitourinary: Negative.   Musculoskeletal: Positive for back pain (chronic).       Right arm pain.  Skin: Negative.   Neurological: Negative.  Negative for weakness and headaches.  Endo/Heme/Allergies: Negative.   Psychiatric/Behavioral: Positive for memory loss (forgetfulness).    Past Medical History:  Diagnosis Date  . Anxiety   . Cancer [facility])    right breast  . Cancer of right breast, stage 2 ([place]) 09/01/2015  . GERD (gastroesophageal reflux disease)   . Graves disease   . Headache   . Hypothyroidism   . PONV (postoperative nausea and vomiting)   . Seizures ([place])     Past Surgical History:  Procedure Laterality Date  . BREAST LUMPECTOMY WITH RADIOACTIVE SEED AND SENTINEL LYMPH NODE BIOPSY Right 01/21/2015   Procedure: RIGHT BREAST LUMPECTOMY WITH RADIOACTIVE SEED AND RIGHT SENTINEL LYMPH NODE BIOPSY;  Surgeon: Stark Klein, MD;  Location: [place];  Service: General;  Laterality: Right;  . CESAREAN SECTION    . PORTACATH PLACEMENT Left 01/21/2015   Procedure: INSERTION PORT-A-CATH;  Surgeon: Dorris Fetch  Barry Dienes, MD;  Location: [place];  Service: General;  Laterality: Left;  . TUBAL LIGATION      History reviewed. No pertinent family history.  Social History   Social History  . Marital status: Married    Spouse name: N/A  . Number of children: N/A  . Years of education: N/A   Social History Main Topics  . Smoking status: Former Research scientist (life sciences)  . Smokeless tobacco: Former Systems developer  . Alcohol use Yes     Comment: social  . Drug use: No  . Sexual activity: Yes    Birth control/ protection: None   Other Topics  Concern  . None   Social History Narrative  . None     PHYSICAL EXAMINATION  ECOG PERFORMANCE STATUS: 1 - Symptomatic but completely ambulatory  Vitals:   01/26/16 1459  BP: (!) 125/58  Pulse: 94  Resp: 18    GENERAL:alert, no distress, well nourished, well developed, comfortable, cooperative, obese, smiling and unaccompanied SKIN: skin color, texture, turgor are normal, no rashes or significant lesions HEAD: Normocephalic, No masses, lesions, tenderness or abnormalities EYES: normal, EOMI, Conjunctiva are pink and non-injected EARS: External ears normal OROPHARYNX:lips, buccal mucosa, and tongue normal and mucous membranes are moist  NECK: supple, no adenopathy, trachea midline LYMPH:  no palpable lymphadenopathy, no hepatosplenomegaly BREAST:not examined LUNGS: clear to auscultation and percussion HEART: regular rate & rhythm, no murmurs, no gallops, S1 normal and S2 normal ABDOMEN:abdomen soft, non-tender, obese, normal bowel sounds and no masses or organomegaly BACK: Back symmetric, no curvature. EXTREMITIES:less then 2 second capillary refill, no joint deformities, effusion, or inflammation, no skin discoloration, no cyanosis.  Mild right arm edema/lymphedema without edema of hands/fingers NEURO: alert & oriented x 3 with fluent speech, no focal motor/sensory deficits, gait normal    LABORATORY DATA: CBC    Component Value Date/Time   WBC 6.2 01/26/2016 1450   RBC 4.67 01/26/2016 1450   HGB 13.6 01/26/2016 1450   HCT 40.0 01/26/2016 1450   PLT 223 01/26/2016 1450   MCV 85.7 01/26/2016 1450   MCH 29.1 01/26/2016 1450   MCHC 34.0 01/26/2016 1450   RDW 13.3 01/26/2016 1450   LYMPHSABS 1.6 01/26/2016 1450   MONOABS 0.3 01/26/2016 1450   EOSABS 0.4 01/26/2016 1450   BASOSABS 0.0 01/26/2016 1450      Chemistry      Component Value Date/Time   NA 137 01/26/2016 1450   K 3.4 (L) 01/26/2016 1450   CL 104 01/26/2016 1450   CO2 25 01/26/2016 1450   BUN 9  01/26/2016 1450   CREATININE 0.81 01/26/2016 1450      Component Value Date/Time   CALCIUM 9.1 01/26/2016 1450   ALKPHOS 60 01/26/2016 1450   AST 29 01/26/2016 1450   ALT 27 01/26/2016 1450   BILITOT 1.4 (H) 01/26/2016 1450        PENDING LABS:   RADIOGRAPHIC STUDIES:  No results found.   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 Barry Dienes on 01/21/2015 with 0/4 lymph nodes and no LVI.  Treated at [facility] with AC x 4 followed by 9/12 cycles of weekly Taxol (02/27/2015- 07/08/2015) which was discontinued early due to skin toxicity.  Now S/P XRT.  Oncology history is updated.  Labs today: CBC diff, CMET.  I personally reviewed and  went over laboratory results with the patient.  The results are noted within this dictation.  I personally reviewed and went over radiographic studies with the patient.  The results are noted within this dictation.  Mammogram from October 2017 is reviewed, BIRADS 2.  She will be due for repeat mammogram in October 2018.  Labs in 3 months: CBC diff, CMET.  She has right upper extremity lymphedema and ache.  Recently started lymphedema management with pneumatic compression sleeve device.  She reports GERD symptoms that OTC Pepcid (or similar) is not helping with.  I will give samples of Nexium.  If effective, she will call us and I will prescribe Nexium.  She reports getting Prolia at [facility] for osteoporosis.  I do not have this documented at this time.  She reports that her last bone density was > 2 years ago.  Order placed for bone density.  If osteoporotic, will build supportive therapy plan and anticipate starting Prolia in the near future.  She continues with low chronic back pain.  She notes that this is at baseline.  Her back pain is making long distance walking difficult.  She  uses Aleve and Ultram for this pain (long-term/chronically).  I will ask staff to ascertain the patient's start and end date of XRT for documentation purposes.  She notes recent intolerance to eggs. I suspect an IgE mediated reaction given the patient's complaints. She notes diarrhea and bloating following egg consumption (approximately 15 minutes following consumption).  Port flushes every 6 weeks.  Return in 3 months for follow-up.    ORDERS PLACED FOR THIS ENCOUNTER: Orders Placed This Encounter  Procedures  . MM DIAG BREAST TOMO BILATERAL  . DG Bone Density  . CBC with Differential  . Comprehensive metabolic panel    MEDICATIONS PRESCRIBED THIS ENCOUNTER: No orders of the defined types were placed in this encounter.   THERAPY PLAN:  NCCN guidelines recommends the following surveillance for invasive breast cancer (2.2017):  A. History and Physical exam 1-4 times per year as clinically appropriate for 5 years, then annually.  B. Periodic screening for changes in family history and referral to genetics counseling as indicated  C. Educate, monitor, and refer to lymphedema management.  D. Mammography every 12 months  E. Routine imaging of reconstructed breast is not indicated.  F. In the absence of clinical signs and symptoms suggestive of recurrent disease, there is no indication for laboratory or imaging studies for metastases screening.  G. Women on Tamoxifen: annual gynecologic assessment every 12 months if uterus is present.  H. Women on aromatase inhibitor or who experience ovarian failure secondary to treatment should have monitoring of bone health with a bone mineral density determination at baseline and periodically thereafter.  I. Assess and encourage adherence to adjuvant endocrine therapy.  J. Evidence suggests that active lifestyle, healthy diet, limited alcohol intake, and achieving and maintaining an ideal body weight (20-25 BMI) may lead to optimal breast cancer  outcomes.   All questions were answered. The patient knows to call the clinic with any problems, questions or concerns. We can certainly see the patient much sooner if necessary.  Patient and plan discussed with Dr. Ancil Linsey and she is in agreement with the aforementioned.   This note is electronically signed by: Doy Mince 01/26/2016 3:38 PM

## 2016-01-26 NOTE — Progress Notes (Signed)
Tara Mcfarland tolerated port lab draw with flush well without complaints or incident. Port accessed with 20 gauge needle with blood drawn for labs ordered. Port then flushed with 20 ml NS and 5 ml Heparin easily per protocol. VSS Pt discharged self ambulatory in satisfactory condition

## 2016-02-01 ENCOUNTER — Ambulatory Visit (HOSPITAL_COMMUNITY)
Admission: RE | Admit: 2016-02-01 | Discharge: 2016-02-01 | Disposition: A | Discharge status: Home / Self Care | Payer: Medicaid Other | Source: Ambulatory Visit | Attending: Oncology | Admitting: Oncology

## 2016-02-01 DIAGNOSIS — M8580 Other specified disorders of bone density and structure, unspecified site: Secondary | ICD-10-CM | POA: Insufficient documentation

## 2016-02-01 DIAGNOSIS — C50911 Malignant neoplasm of unspecified site of right female breast: Secondary | ICD-10-CM | POA: Diagnosis not present

## 2016-02-01 DIAGNOSIS — Z13820 Encounter for screening for osteoporosis: Secondary | ICD-10-CM | POA: Insufficient documentation

## 2016-02-06 ENCOUNTER — Other Ambulatory Visit: Payer: Self-pay

## 2016-02-06 ENCOUNTER — Encounter (HOSPITAL_COMMUNITY): Payer: Self-pay | Admitting: Emergency Medicine

## 2016-02-06 ENCOUNTER — Emergency Department (HOSPITAL_COMMUNITY): Payer: Medicaid Other

## 2016-02-06 ENCOUNTER — Observation Stay (HOSPITAL_COMMUNITY)
Admission: EM | Admit: 2016-02-06 | Discharge: 2016-02-07 | Disposition: A | Discharge status: Home / Self Care | Payer: Medicaid Other | Attending: Internal Medicine | Admitting: Internal Medicine

## 2016-02-06 ENCOUNTER — Other Ambulatory Visit (HOSPITAL_COMMUNITY): Payer: Self-pay

## 2016-02-06 DIAGNOSIS — R197 Diarrhea, unspecified: Secondary | ICD-10-CM | POA: Diagnosis not present

## 2016-02-06 DIAGNOSIS — B179 Acute viral hepatitis, unspecified: Principal | ICD-10-CM | POA: Insufficient documentation

## 2016-02-06 DIAGNOSIS — R1013 Epigastric pain: Secondary | ICD-10-CM

## 2016-02-06 DIAGNOSIS — Z79899 Other long term (current) drug therapy: Secondary | ICD-10-CM | POA: Diagnosis not present

## 2016-02-06 DIAGNOSIS — R7989 Other specified abnormal findings of blood chemistry: Secondary | ICD-10-CM | POA: Diagnosis not present

## 2016-02-06 DIAGNOSIS — R1114 Bilious vomiting: Secondary | ICD-10-CM

## 2016-02-06 DIAGNOSIS — Z87891 Personal history of nicotine dependence: Secondary | ICD-10-CM | POA: Diagnosis not present

## 2016-02-06 DIAGNOSIS — C50911 Malignant neoplasm of unspecified site of right female breast: Secondary | ICD-10-CM | POA: Diagnosis present

## 2016-02-06 DIAGNOSIS — E039 Hypothyroidism, unspecified: Secondary | ICD-10-CM | POA: Diagnosis not present

## 2016-02-06 DIAGNOSIS — Z853 Personal history of malignant neoplasm of breast: Secondary | ICD-10-CM | POA: Diagnosis not present

## 2016-02-06 DIAGNOSIS — R945 Abnormal results of liver function studies: Secondary | ICD-10-CM

## 2016-02-06 DIAGNOSIS — K759 Inflammatory liver disease, unspecified: Secondary | ICD-10-CM | POA: Diagnosis present

## 2016-02-06 LAB — COMPREHENSIVE METABOLIC PANEL
ALBUMIN: 4.3 g/dL (ref 3.5–5.0)
ALT: 370 U/L — ABNORMAL HIGH (ref 14–54)
AST: 884 U/L — AB (ref 15–41)
Alkaline Phosphatase: 121 U/L (ref 38–126)
Anion gap: 10 (ref 5–15)
BILIRUBIN TOTAL: 1.9 mg/dL — AB (ref 0.3–1.2)
BUN: 14 mg/dL (ref 6–20)
CHLORIDE: 106 mmol/L (ref 101–111)
CO2: 23 mmol/L (ref 22–32)
Calcium: 9.6 mg/dL (ref 8.9–10.3)
Creatinine, Ser: 0.84 mg/dL (ref 0.44–1.00)
GFR calc Af Amer: >60 mL/min (ref >60)
GFR calc non Af Amer: >60 mL/min (ref >60)
GLUCOSE: 123 mg/dL — AB (ref 65–99)
POTASSIUM: 3.7 mmol/L (ref 3.5–5.1)
Sodium: 139 mmol/L (ref 135–145)
TOTAL PROTEIN: 7.7 g/dL (ref 6.5–8.1)

## 2016-02-06 LAB — URINALYSIS, ROUTINE W REFLEX MICROSCOPIC
BACTERIA UA: NONE SEEN
Bilirubin Urine: NEGATIVE
Glucose, UA: NEGATIVE mg/dL
KETONES UR: NEGATIVE mg/dL
LEUKOCYTES UA: NEGATIVE
Nitrite: NEGATIVE
PROTEIN: NEGATIVE mg/dL
Specific Gravity, Urine: 1.021 (ref 1.005–1.030)
pH: 6 (ref 5.0–8.0)

## 2016-02-06 LAB — CBC WITH DIFFERENTIAL/PLATELET
BASOS ABS: 0 10*3/uL (ref 0.0–0.1)
BASOS PCT: 0 %
Eosinophils Absolute: 0.1 10*3/uL (ref 0.0–0.7)
Eosinophils Relative: 2 %
HEMATOCRIT: 41.8 % (ref 36.0–46.0)
HEMOGLOBIN: 14.2 g/dL (ref 12.0–15.0)
Lymphocytes Relative: 10 %
Lymphs Abs: 0.7 10*3/uL (ref 0.7–4.0)
MCH: 29 pg (ref 26.0–34.0)
MCHC: 34 g/dL (ref 30.0–36.0)
MCV: 85.5 fL (ref 78.0–100.0)
MONO ABS: 0.6 10*3/uL (ref 0.1–1.0)
Monocytes Relative: 9 %
NEUTROS ABS: 5.8 10*3/uL (ref 1.7–7.7)
NEUTROS PCT: 79 %
Platelets: 231 10*3/uL (ref 150–400)
RBC: 4.89 MIL/uL (ref 3.87–5.11)
RDW: 13 % (ref 11.5–15.5)
WBC: 7.2 10*3/uL (ref 4.0–10.5)

## 2016-02-06 LAB — TROPONIN I

## 2016-02-06 LAB — LIPASE, BLOOD: Lipase: 29 U/L (ref 11–51)

## 2016-02-06 MED ORDER — ONDANSETRON HCL 4 MG/2ML IJ SOLN
INTRAMUSCULAR | Status: AC
  Administered 2016-02-06: 4 mg via INTRAVENOUS
  Filled 2016-02-06: qty 2

## 2016-02-06 MED ORDER — ONDANSETRON HCL 4 MG/2ML IJ SOLN
4.0000 mg | Freq: Once | INTRAMUSCULAR | Status: AC
  Administered 2016-02-06: 4 mg via INTRAVENOUS

## 2016-02-06 MED ORDER — GI COCKTAIL ~~LOC~~
30.0000 mL | Freq: Three times a day (TID) | ORAL | Status: DC | PRN
  Administered 2016-02-06 – 2016-02-07 (×2): 30 mL via ORAL
  Filled 2016-02-06 (×2): qty 30

## 2016-02-06 MED ORDER — ACETAMINOPHEN 650 MG RE SUPP: 650.0000 mg | Freq: Four times a day (QID) | RECTAL | Status: DC | PRN

## 2016-02-06 MED ORDER — TRAMADOL HCL ER 100 MG PO TB24: 100.0000 mg | ORAL_TABLET | Freq: Two times a day (BID) | ORAL | Status: DC

## 2016-02-06 MED ORDER — HYDROMORPHONE HCL 1 MG/ML IJ SOLN
1.0000 mg | Freq: Once | INTRAMUSCULAR | Status: AC
  Administered 2016-02-06: 1 mg via INTRAVENOUS
  Filled 2016-02-06: qty 1

## 2016-02-06 MED ORDER — HYDROMORPHONE HCL 1 MG/ML IJ SOLN
INTRAMUSCULAR | Status: AC
  Administered 2016-02-06: 1 mg via INTRAVENOUS
  Filled 2016-02-06: qty 1

## 2016-02-06 MED ORDER — ENOXAPARIN SODIUM 40 MG/0.4ML ~~LOC~~ SOLN
40.0000 mg | SUBCUTANEOUS | Status: DC
  Administered 2016-02-06: 40 mg via SUBCUTANEOUS
  Filled 2016-02-06 (×2): qty 0.4

## 2016-02-06 MED ORDER — CYCLOBENZAPRINE HCL 10 MG PO TABS: 10.0000 mg | ORAL_TABLET | Freq: Two times a day (BID) | ORAL | Status: DC | PRN

## 2016-02-06 MED ORDER — IOPAMIDOL (ISOVUE-300) INJECTION 61%
100.0000 mL | Freq: Once | INTRAVENOUS | Status: AC | PRN
  Administered 2016-02-06: 100 mL via INTRAVENOUS

## 2016-02-06 MED ORDER — LEVOTHYROXINE SODIUM 112 MCG PO TABS
112.0000 ug | ORAL_TABLET | Freq: Every day | ORAL | Status: DC
  Administered 2016-02-07: 112 ug via ORAL
  Filled 2016-02-06: qty 1

## 2016-02-06 MED ORDER — POTASSIUM CHLORIDE IN NACL 20-0.9 MEQ/L-% IV SOLN
INTRAVENOUS | Status: DC
  Administered 2016-02-06 – 2016-02-07 (×2): via INTRAVENOUS

## 2016-02-06 MED ORDER — ACETAMINOPHEN 325 MG PO TABS: 650.0000 mg | ORAL_TABLET | Freq: Four times a day (QID) | ORAL | Status: DC | PRN

## 2016-02-06 MED ORDER — TRAMADOL HCL 50 MG PO TABS
50.0000 mg | ORAL_TABLET | Freq: Four times a day (QID) | ORAL | Status: DC
  Administered 2016-02-06 – 2016-02-07 (×4): 50 mg via ORAL
  Filled 2016-02-06 (×5): qty 1

## 2016-02-06 MED ORDER — KETOROLAC TROMETHAMINE 30 MG/ML IJ SOLN
30.0000 mg | Freq: Once | INTRAMUSCULAR | Status: AC
  Administered 2016-02-06: 30 mg via INTRAVENOUS
  Filled 2016-02-06: qty 1

## 2016-02-06 MED ORDER — FENTANYL CITRATE (PF) 100 MCG/2ML IJ SOLN: 25.0000 ug | INTRAMUSCULAR | Status: DC | PRN

## 2016-02-06 MED ORDER — HYDROMORPHONE HCL 1 MG/ML IJ SOLN
1.0000 mg | Freq: Once | INTRAMUSCULAR | Status: AC
  Administered 2016-02-06: 1 mg via INTRAVENOUS

## 2016-02-06 MED ORDER — IOPAMIDOL (ISOVUE-300) INJECTION 61%
INTRAVENOUS | Status: AC
  Administered 2016-02-06: 15 mL via ORAL
  Filled 2016-02-06: qty 30

## 2016-02-06 MED ORDER — KETOROLAC TROMETHAMINE 30 MG/ML IJ SOLN
30.0000 mg | Freq: Four times a day (QID) | INTRAMUSCULAR | Status: DC | PRN
  Administered 2016-02-07: 30 mg via INTRAVENOUS
  Filled 2016-02-06: qty 1

## 2016-02-06 MED ORDER — PANTOPRAZOLE SODIUM 40 MG PO TBEC
40.0000 mg | DELAYED_RELEASE_TABLET | Freq: Two times a day (BID) | ORAL | Status: DC
  Administered 2016-02-06 – 2016-02-07 (×3): 40 mg via ORAL
  Filled 2016-02-06 (×3): qty 1

## 2016-02-06 MED ORDER — PROMETHAZINE HCL 25 MG/ML IJ SOLN
12.5000 mg | INTRAMUSCULAR | Status: DC | PRN
  Administered 2016-02-07: 12.5 mg via INTRAVENOUS
  Filled 2016-02-06: qty 1

## 2016-02-06 MED ORDER — PROMETHAZINE HCL 25 MG/ML IJ SOLN
12.5000 mg | Freq: Once | INTRAMUSCULAR | Status: AC
  Administered 2016-02-06: 12.5 mg via INTRAVENOUS
  Filled 2016-02-06: qty 1

## 2016-02-06 MED ORDER — PANTOPRAZOLE SODIUM 40 MG IV SOLR
40.0000 mg | Freq: Once | INTRAVENOUS | Status: AC
  Administered 2016-02-06: 40 mg via INTRAVENOUS
  Filled 2016-02-06: qty 40

## 2016-02-06 NOTE — ED Triage Notes (Signed)
Patient c/o upper abd pain that radiates into back and lower abd with nausea. Denies any vomiting or fevers. Per patient had diarrhea yesterday before pain started last night. Patient states feels like a sharp stabbing pain.

## 2016-02-06 NOTE — H&P (Signed)
History and Physical    Tara Mcfarland [id number] DOB: [birth year] DOA: 02/06/2016  PCP: [name], PA-C  Patient coming from: home  Chief Complaint: epigastric discomfort  HPI: Tara Mcfarland is a 55 y.o. female with medical history significant of hypothyroidism, GERD, breast cancer, presents to the hospital with complaints of epigastric discomfort. She reports onset of symptoms occurring at 1 AM this morning and woke her up from sleep. Abdominal discomfort is located in the epigastrium, radiates through to her back and laterally around her trunk. She had nausea but no significant vomiting. She did have 4-5 episodes of diarrhea yesterday, but they have since resolved. No fever. Symptoms persisted until she came to the emergency room and received pain medications. She has not had any shortness of breath, chest pain, dysuria or other symptoms.  ED Course: In the emergency room she was noted to be hemodynamically stable. Lab work indicated elevated liver enzymes in the 800 range. Bilirubin was mildly elevated at 1.9. Abdominal ultrasound indicated gallbladder sludge with borderline gallbladder wall thickening and negative Murphy sign. There was also mention of fatty liver. She was treated supportively in the emergency room, but since symptoms are persisting, admission was requested for observation.  Review of Systems: As per HPI otherwise 10 point review of systems negative.   Past Medical History:  Diagnosis Date  . Anxiety   . Cancer [facility])    right breast  . Cancer of right breast, stage 2 ([place]) 09/01/2015  . GERD (gastroesophageal reflux disease)   . Graves disease   . Headache   . Hypothyroidism   . PONV (postoperative nausea and vomiting)   . Seizures ([place])     Past Surgical History:  Procedure Laterality Date  . BREAST LUMPECTOMY WITH RADIOACTIVE SEED AND SENTINEL LYMPH NODE BIOPSY Right 01/21/2015   Procedure: RIGHT BREAST LUMPECTOMY WITH RADIOACTIVE SEED AND RIGHT  SENTINEL LYMPH NODE BIOPSY;  Surgeon: Stark Klein, MD;  Location: [place];  Service: General;  Laterality: Right;  . CESAREAN SECTION    . PORTACATH PLACEMENT Left 01/21/2015   Procedure: INSERTION PORT-A-CATH;  Surgeon: Stark Klein, MD;  Location: [place];  Service: General;  Laterality: Left;  . TUBAL LIGATION       reports that she has quit smoking. Her smoking use included Cigarettes. She has never used smokeless tobacco. She reports that she drinks alcohol. She reports that she does not use drugs.  Allergies  Allergen Reactions  . Eggs Or Egg-Derived Products Other (See Comments)    Severe pain In stomach area stomach swells up  . Penicillins Hives  . Penicillin G Rash    Family history: Family history reviewed and not pertinent  Prior to Admission medications   Medication Sig Start Date End Date Taking? Authorizing Provider  calcium carbonate (OS-CAL - DOSED IN MG OF ELEMENTAL CALCIUM) 1250 (500 CA) MG tablet Take 1 tablet by mouth.   Yes Historical Provider, MD  cholecalciferol (VITAMIN D) 1000 UNITS tablet Take 6,000 Units by mouth daily.   Yes Historical Provider, MD  cyclobenzaprine (FLEXERIL) 10 MG tablet Take 10 mg by mouth 2 (two) times daily as needed for muscle spasms (fibrolmyalgia).   Yes Historical Provider, MD  levothyroxine (SYNTHROID, LEVOTHROID) 112 MCG tablet Take 112 mcg by mouth daily.   Yes Historical Provider, MD  magnesium oxide (MAG-OX) 400 MG tablet Take 400 mg by mouth daily.   Yes Historical Provider, MD  SUMAtriptan (IMITREX) 50 MG tablet Take 50 mg by  mouth every 2 (two) hours as needed for migraine. May repeat in 2 hours if headache persists or recurs.   Yes Historical Provider, MD  traMADol (ULTRAM-ER) 100 MG 24 hr tablet Take 100 mg by mouth 2 (two) times daily.   Yes Historical Provider, MD    Physical Exam: Vitals:   02/06/16 0930 02/06/16 0949 02/06/16 1626 02/06/16 1644  BP: 134/76 134/76 122/65 133/68    Pulse: 60 63 68 63  Resp: 16 11 15 18   Temp:    98.2 F (36.8 C)  TempSrc:    Oral  SpO2: 97% 96% 98% 100%  Weight:    89.3 kg (196 lb 14.4 oz)  Height:    5\' 4"  (1.626 m)      Constitutional: NAD, calm, comfortable Vitals:   02/06/16 0930 02/06/16 0949 02/06/16 1626 02/06/16 1644  BP: 134/76 134/76 122/65 133/68  Pulse: 60 63 68 63  Resp: 16 11 15 18   Temp:    98.2 F (36.8 C)  TempSrc:    Oral  SpO2: 97% 96% 98% 100%  Weight:    89.3 kg (196 lb 14.4 oz)  Height:    5\' 4"  (1.626 m)   Eyes: PERRL, lids and conjunctivae normal ENMT: Mucous membranes are moist. Posterior pharynx clear of any exudate or lesions.Normal dentition.  Neck: normal, supple, no masses, no thyromegaly Respiratory: clear to auscultation bilaterally, no wheezing, no crackles. Normal respiratory effort. No accessory muscle use.  Cardiovascular: Regular rate and rhythm, no murmurs / rubs / gallops. No extremity edema. 2+ pedal pulses. No carotid bruits.  Abdomen: Mild tenderness in right upper quadrant and epigastrium, no masses palpated. No hepatosplenomegaly. Bowel sounds positive.  Musculoskeletal: no clubbing / cyanosis. No joint deformity upper and lower extremities. Good ROM, no contractures. Normal muscle tone.  Skin: no rashes, lesions, ulcers. No induration Neurologic: CN 2-12 grossly intact. Sensation intact, DTR normal. Strength 5/5 in all 4.  Psychiatric: Normal judgment and insight. Alert and oriented x 3. Normal mood.     Labs on Admission: I have personally reviewed following labs and imaging studies  CBC:  Recent Labs Lab 02/06/16 0859  WBC 7.2  NEUTROABS 5.8  HGB 14.2  HCT 41.8  MCV 85.5  PLT [id number]   Basic Metabolic Panel:  Recent Labs Lab 02/06/16 0859  NA 139  K 3.7  CL 106  CO2 23  GLUCOSE 123*  BUN 14  CREATININE 0.84  CALCIUM 9.6   GFR: Estimated Creatinine Clearance: 81.8 mL/min (by C-G formula based on SCr of 0.84 mg/dL). Liver Function Tests:  Recent  Labs Lab 02/06/16 0859  AST 884*  ALT 370*  ALKPHOS 121  BILITOT 1.9*  PROT 7.7  ALBUMIN 4.3    Recent Labs Lab 02/06/16 0859  LIPASE 29   No results for input(s): AMMONIA in the last 168 hours. Coagulation Profile: No results for input(s): INR, PROTIME in the last 168 hours. Cardiac Enzymes:  Recent Labs Lab 02/06/16 0905  TROPONINI <0.03   BNP (last 3 results) No results for input(s): PROBNP in the last 8760 hours. HbA1C: No results for input(s): HGBA1C in the last 72 hours. CBG: No results for input(s): GLUCAP in the last 168 hours. Lipid Profile: No results for input(s): CHOL, HDL, LDLCALC, TRIG, CHOLHDL, LDLDIRECT in the last 72 hours. Thyroid Function Tests: No results for input(s): TSH, T4TOTAL, FREET4, T3FREE, THYROIDAB in the last 72 hours. Anemia Panel: No results for input(s): VITAMINB12, FOLATE, FERRITIN, TIBC, IRON, RETICCTPCT in the last  72 hours. Urine analysis:    Component Value Date/Time   COLORURINE YELLOW 02/06/2016 0900   APPEARANCEUR CLEAR 02/06/2016 0900   LABSPEC 1.021 02/06/2016 0900   PHURINE 6.0 02/06/2016 0900   GLUCOSEU NEGATIVE 02/06/2016 0900   HGBUR SMALL (A) 02/06/2016 0900   BILIRUBINUR NEGATIVE 02/06/2016 0900   KETONESUR NEGATIVE 02/06/2016 0900   PROTEINUR NEGATIVE 02/06/2016 0900   UROBILINOGEN 0.2 09/02/2009 0000   NITRITE NEGATIVE 02/06/2016 0900   LEUKOCYTESUR NEGATIVE 02/06/2016 0900   Sepsis Labs: !!!!!!!!!!!!!!!!!!!!!!!!!!!!!!!!!!!!!!!!!!!! @LABRCNTIP (procalcitonin:4,lacticidven:4) )No results found for this or any previous visit (from the past 240 hour(s)).   Radiological Exams on Admission: US Abdomen Complete  Result Date: 02/06/2016 CLINICAL DATA:  Epigastric pain, nausea vomiting. History of right breast cancer, diagnosed in 2016. EXAM: ABDOMEN ULTRASOUND COMPLETE COMPARISON:  None. FINDINGS: Gallbladder: The gallbladder is normally distended. Layering sludge is seen. 1 cm mobile calculus is also seen. The  gallbladder wall is slightly thickened to 3.3 mm. Sonographic Murphy's sign was reported as negative. Common bile duct: Diameter: 3.3 mm. Liver: Diffusely increased echogenicity of the liver. In the dome of the right lobe of the liver there is a subcapsular 0.9 x 1.0 x 0.7 cm hypoechoic mass. IVC: No abnormality visualized. Pancreas: Visualized portion unremarkable. Spleen: Size and appearance within normal limits. Right Kidney: Length: 11.1. Echogenicity within normal limits. No mass or hydronephrosis visualized. Left Kidney: Length: 10.9. Echogenicity within normal limits. No mass or hydronephrosis visualized. Abdominal aorta: No aneurysm visualized. Other findings: None. IMPRESSION: Gallbladder sludge with borderline gallbladder wall thickening, which may be seen in early acute cholecystitis. The sonographic Murphy's sign however was negative, which argues against acute cholecystitis, unless the patient was medicated. Diffusely increased echogenicity of the liver which may be seen with hepatic steatosis. Indeterminate 1 cm hypoechoic subcapsular right lobe of the liver lesion. Further evaluation with MRI or CT, liver protocol, may be considered, as metastatic lesion cannot be excluded. Electronically Signed   By: Fidela Salisbury M.D.   On: 02/06/2016 10:48   Ct Abdomen Pelvis W Contrast  Result Date: 02/06/2016 CLINICAL DATA:  Upper abdominal pain radiating to back and to lower abdomen with associated nausea, patient had diarrhea yesterday, sharp stabbing pain, RIGHT lobe liver mass on ultrasound, history RIGHT breast cancer post radiation therapy and chemotherapy EXAM: CT ABDOMEN AND PELVIS WITH CONTRAST TECHNIQUE: Multidetector CT imaging of the abdomen and pelvis was performed using the standard protocol following bolus administration of intravenous contrast. Sagittal and coronal MPR images reconstructed from axial data set. CONTRAST:  168mL ISOVUE-300 IOPAMIDOL (ISOVUE-300) INJECTION 61% IV. Dilute  oral contrast. COMPARISON:  02/24/2015 FINDINGS: Lower chest: Minimal subpleural scarring at anterolateral RIGHT mid lung likely related to prior chest wall radiation therapy. Lung bases otherwise clear. Hepatobiliary: Fatty infiltration of liver with mild focal sparing adjacent to gallbladder fossa. Dependent calcified gallstones within gallbladder. Subcapsular low-attenuation nodule lateral RIGHT lobe liver 11 x 11 x 8 mm, likely corresponding to the ultrasound finding, unchanged from 02/24/2015 exam. This lesion measured 9 x 9 x 11 mm on 09/02/2009, overall demonstrating little changed over 6+ years. Pancreas: Normal appearance Spleen: Normal appearance. Probable tiny splenules in LEFT upper quadrant. Adrenals/Urinary Tract: Adrenal glands normal appearance. Small RIGHT renal cysts. Kidneys, kidneys, ureters, and bladder otherwise normal appearance. Stomach/Bowel: Minimal colonic diverticulosis. Normal appendix. Stomach and small bowel loops normal appearance. Vascular/Lymphatic: Aorta normal caliber.  No adenopathy. Reproductive: Unremarkable uterus and ovaries Other: No free air or free fluid.  No hernia. Musculoskeletal: Bones demineralized. IMPRESSION:  Fatty infiltration of liver with a small low-attenuation nodule a tthe lateral aspect of the RIGHT lobe of the liver, 11 x 11 x 8 mm, little changed from the 11 x 9 x 9 mm size in 2011, likely benign. Minimal colonic diverticulosis. No acute intra-abdominal or intrapelvic abnormalities. Electronically Signed   By: Lavonia Dana M.D.   On: 02/06/2016 13:23    EKG: Independently reviewed. No acute changes  Assessment/Plan Principal Problem:   Hepatitis Active Problems:   Cancer of right breast, stage 2 ([facility])   Hypothyroidism   Elevated LFTs   Elevated liver enzymes. Etiology isn't entirely clear. Hepatitis panel has been sent. She may have passed a gallstone. No evidence of ductal dilatation. Will treat supportively overnight. Repeat labs in the  morning. Hepatitis panel is pending.  Epigastric discomfort. Possibly related to cholecystitis. May also have an element of GERD. Continue on GI cocktail and Protonix. Treat supportively.  Hypothyroidism. Continue Synthroid  History of breast cancer. Continue to follow with oncology.   DVT prophylaxis: lovenox Code Status: full Family Communication: discussed with daughters at the bedside Disposition Plan: discharge home once improved Consults called:  Admission status: observation, Dollene Cleveland MD [facility] Pager [phone]  If 7PM-7AM, please contact night-coverage [email or web address] Password [email or web address]  02/06/2016, 5:20 PM

## 2016-02-06 NOTE — ED Provider Notes (Signed)
[facility] DEPT Provider Note   CSN: [id number] Arrival date & time: 02/06/16  [id number]     History   Chief Complaint Chief Complaint  Patient presents with  . Abdominal Pain    HPI Tara Mcfarland is a 55 y.o. female presenting with sudden onset of epigastric pain radiating into her mid back and lower abdomen along with nausea which woke her at 1 am today.  She denies chest pain or shortness of breath but reports taking shallow breaths since deeper breaths worsens her pain.  She reports having diarrhea yesterday without abdominal pain but this is a frequent symptom for her since she completed chemo 7/17 for right breast cancer and really cannot say this symptom is related to todays pain. At one point the pain was radiating into her chest but not currently. Last PO intake 8 pm, pork roast and potatoes.  The history is provided by the patient.    Past Medical History:  Diagnosis Date  . Anxiety   . Cancer [facility])    right breast  . Cancer of right breast, stage 2 ([place]) 09/01/2015  . GERD (gastroesophageal reflux disease)   . Graves disease   . Headache   . Hypothyroidism   . PONV (postoperative nausea and vomiting)   . Seizures [facility])     Patient Active Problem List   Diagnosis Date Noted  . Hepatitis 02/06/2016  . Osteoporosis 09/04/2015  . Lupus (systemic lupus erythematosus) ([place]) 09/04/2015  . Cancer of right breast, stage 2 ([place]) 09/01/2015  . Dermatitis, drug-induced 07/22/2015  . Chronic midline back pain 07/08/2015  . Dermatitis 07/01/2015  . Neuropathy ([place]) 05/28/2015  . Chemotherapy-induced neutropenia ([place]) 04/01/2015  . Nausea 04/01/2015  . Encounter for antineoplastic chemotherapy 02/27/2015  . Port-a-cath in place 02/27/2015  . Long term current use of systemic steroids 02/11/2015  . History of cardiomegaly 12/25/2014  . History of lupus 12/25/2014  . Depression 02/16/2010  . Fibromyalgia 02/16/2010  . Thyroid dysfunction 02/16/2010    Past  Surgical History:  Procedure Laterality Date  . BREAST LUMPECTOMY WITH RADIOACTIVE SEED AND SENTINEL LYMPH NODE BIOPSY Right 01/21/2015   Procedure: RIGHT BREAST LUMPECTOMY WITH RADIOACTIVE SEED AND RIGHT SENTINEL LYMPH NODE BIOPSY;  Surgeon: Stark Klein, MD;  Location: [place];  Service: General;  Laterality: Right;  . CESAREAN SECTION    . PORTACATH PLACEMENT Left 01/21/2015   Procedure: INSERTION PORT-A-CATH;  Surgeon: Stark Klein, MD;  Location: [place];  Service: General;  Laterality: Left;  . TUBAL LIGATION      OB History    No data available       Home Medications    Prior to Admission medications   Medication Sig Start Date End Date Taking? Authorizing Provider  calcium carbonate (OS-CAL - DOSED IN MG OF ELEMENTAL CALCIUM) 1250 (500 CA) MG tablet Take 1 tablet by mouth.   Yes Historical Provider, MD  cholecalciferol (VITAMIN D) 1000 UNITS tablet Take 6,000 Units by mouth daily.   Yes Historical Provider, MD  cyclobenzaprine (FLEXERIL) 10 MG tablet Take 10 mg by mouth 2 (two) times daily as needed for muscle spasms (fibrolmyalgia).   Yes Historical Provider, MD  levothyroxine (SYNTHROID, LEVOTHROID) 112 MCG tablet Take 112 mcg by mouth daily.   Yes Historical Provider, MD  magnesium oxide (MAG-OX) 400 MG tablet Take 400 mg by mouth daily.   Yes Historical Provider, MD  SUMAtriptan (IMITREX) 50 MG tablet Take 50 mg by mouth every 2 (two) hours as  needed for migraine. May repeat in 2 hours if headache persists or recurs.   Yes Historical Provider, MD  traMADol (ULTRAM-ER) 100 MG 24 hr tablet Take 100 mg by mouth 2 (two) times daily.   Yes Historical Provider, MD    Family History No family history on file.  Social History Social History  Substance Use Topics  . Smoking status: Former Smoker    Types: Cigarettes  . Smokeless tobacco: Never Used  . Alcohol use Yes     Comment: social     Allergies   Eggs or egg-derived products;  Penicillins; and Penicillin g   Review of Systems Review of Systems  Constitutional: Negative for fever.  HENT: Negative for congestion and sore throat.   Eyes: Negative.   Respiratory: Negative for chest tightness and shortness of breath.   Cardiovascular: Negative for chest pain.  Gastrointestinal: Positive for abdominal pain, diarrhea and nausea.  Genitourinary: Negative.   Musculoskeletal: Negative for arthralgias, joint swelling and neck pain.  Skin: Negative.  Negative for rash and wound.  Neurological: Negative for dizziness, weakness, light-headedness, numbness and headaches.  Psychiatric/Behavioral: Negative.      Physical Exam Updated Vital Signs BP 134/76 (BP Location: Left Arm)   Pulse 63   Temp 97.5 F (36.4 C) (Oral)   Resp 11   Ht 5\' 4"  (1.626 m)   Wt 89.8 kg   SpO2 96%   BMI 33.99 kg/m   Physical Exam  Constitutional: She appears well-developed and well-nourished.  HENT:  Head: Normocephalic and atraumatic.  Eyes: Conjunctivae are normal.  Neck: Normal range of motion.  Cardiovascular: Normal rate, regular rhythm, normal heart sounds and intact distal pulses.   Pulmonary/Chest: Effort normal and breath sounds normal. She has no wheezes.  Abdominal: Soft. Bowel sounds are normal. There is tenderness. There is guarding.  Epigastric ttp.  Guarding. Bowel sounds present, quiet.  Musculoskeletal: Normal range of motion.  Neurological: She is alert.  Skin: Skin is warm and dry.  Psychiatric: She has a normal mood and affect.  Nursing note and vitals reviewed.    ED Treatments / Results  Labs (all labs ordered are listed, but only abnormal results are displayed) Labs Reviewed  COMPREHENSIVE METABOLIC PANEL - Abnormal; Notable for the following:       Result Value   Glucose, Bld 123 (*)    AST 884 (*)    ALT 370 (*)    Total Bilirubin 1.9 (*)    All other components within normal limits  URINALYSIS, ROUTINE W REFLEX MICROSCOPIC - Abnormal; Notable  for the following:    Hgb urine dipstick SMALL (*)    All other components within normal limits  CBC WITH DIFFERENTIAL/PLATELET  LIPASE, BLOOD  TROPONIN I  HEPATITIS PANEL, ACUTE    EKG  EKG Interpretation None       Radiology US Abdomen Complete  Result Date: 02/06/2016 CLINICAL DATA:  Epigastric pain, nausea vomiting. History of right breast cancer, diagnosed in 2016. EXAM: ABDOMEN ULTRASOUND COMPLETE COMPARISON:  None. FINDINGS: Gallbladder: The gallbladder is normally distended. Layering sludge is seen. 1 cm mobile calculus is also seen. The gallbladder wall is slightly thickened to 3.3 mm. Sonographic Murphy's sign was reported as negative. Common bile duct: Diameter: 3.3 mm. Liver: Diffusely increased echogenicity of the liver. In the dome of the right lobe of the liver there is a subcapsular 0.9 x 1.0 x 0.7 cm hypoechoic mass. IVC: No abnormality visualized. Pancreas: Visualized portion unremarkable. Spleen: Size and appearance within  normal limits. Right Kidney: Length: 11.1. Echogenicity within normal limits. No mass or hydronephrosis visualized. Left Kidney: Length: 10.9. Echogenicity within normal limits. No mass or hydronephrosis visualized. Abdominal aorta: No aneurysm visualized. Other findings: None. IMPRESSION: Gallbladder sludge with borderline gallbladder wall thickening, which may be seen in early acute cholecystitis. The sonographic Murphy's sign however was negative, which argues against acute cholecystitis, unless the patient was medicated. Diffusely increased echogenicity of the liver which may be seen with hepatic steatosis. Indeterminate 1 cm hypoechoic subcapsular right lobe of the liver lesion. Further evaluation with MRI or CT, liver protocol, may be considered, as metastatic lesion cannot be excluded. Electronically Signed   By: Fidela Salisbury M.D.   On: 02/06/2016 10:48   Ct Abdomen Pelvis W Contrast  Result Date: 02/06/2016 CLINICAL DATA:  Upper abdominal  pain radiating to back and to lower abdomen with associated nausea, patient had diarrhea yesterday, sharp stabbing pain, RIGHT lobe liver mass on ultrasound, history RIGHT breast cancer post radiation therapy and chemotherapy EXAM: CT ABDOMEN AND PELVIS WITH CONTRAST TECHNIQUE: Multidetector CT imaging of the abdomen and pelvis was performed using the standard protocol following bolus administration of intravenous contrast. Sagittal and coronal MPR images reconstructed from axial data set. CONTRAST:  158mL ISOVUE-300 IOPAMIDOL (ISOVUE-300) INJECTION 61% IV. Dilute oral contrast. COMPARISON:  02/24/2015 FINDINGS: Lower chest: Minimal subpleural scarring at anterolateral RIGHT mid lung likely related to prior chest wall radiation therapy. Lung bases otherwise clear. Hepatobiliary: Fatty infiltration of liver with mild focal sparing adjacent to gallbladder fossa. Dependent calcified gallstones within gallbladder. Subcapsular low-attenuation nodule lateral RIGHT lobe liver 11 x 11 x 8 mm, likely corresponding to the ultrasound finding, unchanged from 02/24/2015 exam. This lesion measured 9 x 9 x 11 mm on 09/02/2009, overall demonstrating little changed over 6+ years. Pancreas: Normal appearance Spleen: Normal appearance. Probable tiny splenules in LEFT upper quadrant. Adrenals/Urinary Tract: Adrenal glands normal appearance. Small RIGHT renal cysts. Kidneys, kidneys, ureters, and bladder otherwise normal appearance. Stomach/Bowel: Minimal colonic diverticulosis. Normal appendix. Stomach and small bowel loops normal appearance. Vascular/Lymphatic: Aorta normal caliber.  No adenopathy. Reproductive: Unremarkable uterus and ovaries Other: No free air or free fluid.  No hernia. Musculoskeletal: Bones demineralized. IMPRESSION: Fatty infiltration of liver with a small low-attenuation nodule a tthe lateral aspect of the RIGHT lobe of the liver, 11 x 11 x 8 mm, little changed from the 11 x 9 x 9 mm size in 2011, likely benign.  Minimal colonic diverticulosis. No acute intra-abdominal or intrapelvic abnormalities. Electronically Signed   By: Lavonia Dana M.D.   On: 02/06/2016 13:23    Procedures Procedures (including critical care time)  Medications Ordered in ED Medications  ondansetron (ZOFRAN) injection 4 mg (4 mg Intravenous Given 02/06/16 0859)  HYDROmorphone (DILAUDID) injection 1 mg (1 mg Intravenous Given 02/06/16 0900)  HYDROmorphone (DILAUDID) injection 1 mg (1 mg Intravenous Given 02/06/16 1147)  pantoprazole (PROTONIX) injection 40 mg (40 mg Intravenous Given 02/06/16 0945)  iopamidol (ISOVUE-300) 61 % injection (15 mLs Oral Contrast Given 02/06/16 1233)  ondansetron (ZOFRAN) injection 4 mg (4 mg Intravenous Given 02/06/16 1147)  iopamidol (ISOVUE-300) 61 % injection 100 mL (100 mLs Intravenous Contrast Given 02/06/16 1233)  ondansetron (ZOFRAN) injection 4 mg (4 mg Intravenous Given 02/06/16 1301)  ketorolac (TORADOL) 30 MG/ML injection 30 mg (30 mg Intravenous Given 02/06/16 1429)  promethazine (PHENERGAN) injection 12.5 mg (12.5 mg Intravenous Given 02/06/16 1429)     Initial Impression / Assessment and Plan / ED Course  I  have reviewed the triage vital signs and the nursing notes.  Pertinent labs & imaging results that were available during my care of the patient were reviewed by me and considered in my medical decision making (see chart for details).  Clinical Course     Labs and imaging reviewed.  Discussed with Dr Rosana Hoes who reviewed labs and imaging. Pt does not have acute cholecystitis, possibly passing sludge or could have passed a stone, but no acute indication for surgical intervention.  Advised trial of toradol.  If pain resolved with this medicine can f/u as outpatient from a surgical perspective.    3:02 PM Recheck of pt. Pain improved,  Still very nauseated, actively dry heaving after 3rd dose of zofran.  Phenergan ordered.  Acute hepatitis panel ordered. Will call for  admission.  Discussed with Dr. Roderic Palau, will admit for overnight observation.  Temp orders placed.   Final Clinical Impressions(s) / ED Diagnoses   Final diagnoses:  Acute hepatitis  Epigastric pain  Bilious vomiting with nausea    New Prescriptions New Prescriptions   No medications on file     Evalee Jefferson, Hershal Coria 02/06/16 [place], MD 02/07/16 [phone]

## 2016-02-06 NOTE — ED Notes (Signed)
Patient reports improvement of nausea. Sipping on Ginger Ale slowly.

## 2016-02-06 NOTE — Consult Note (Signed)
SURGICAL CONSULTATION NOTE (initial) - cpt: [id number]  HISTORY OF PRESENT ILLNESS (HPI):  55 y.o. female presented with epigastric abdominal pain, severe persistent nausea, recent diarrhea, and symptoms described as "heartburn" that began rather suddenly after patient reports having eaten beef and milk. She recalls having once experienced nearly identical symptoms during her last cycle of chemotherapy (08/2015) for breast cancer and takes Pepcid for GERD, for which she also avoids onions and several other foods that seem to cause her abdominal pain and severe GERD. Her pain while in the ED was not relieved with doses of Dilaudid, but it resolved completely with a single dose of Toradol. However, her nausea was not as easily controlled until it too completely resolved tonight after she was given a GI cocktail including a PPI. Of note, patient mentions Nexium was once previously prescribed without relief, and she requests a prescription for Protonix or Prilosec. Patient currently denies abdominal pain or nausea and likewise denies CP, SOB, or fever/chills.  Surgery is consulted by medical physician Dr. Roderic Palau in this context for evaluation and management of abdominal pain.  PAST MEDICAL HISTORY (PMH):  Past Medical History:  Diagnosis Date  . Anxiety   . Cancer [facility])    right breast  . Cancer of right breast, stage 2 ([place]) 09/01/2015  . GERD (gastroesophageal reflux disease)   . Graves disease   . Headache   . Hypothyroidism   . PONV (postoperative nausea and vomiting)   . Seizures ([place])      PAST SURGICAL HISTORY ([place]):  Past Surgical History:  Procedure Laterality Date  . BREAST LUMPECTOMY WITH RADIOACTIVE SEED AND SENTINEL LYMPH NODE BIOPSY Right 01/21/2015   Procedure: RIGHT BREAST LUMPECTOMY WITH RADIOACTIVE SEED AND RIGHT SENTINEL LYMPH NODE BIOPSY;  Surgeon: Stark Klein, MD;  Location: [place];  Service: General;  Laterality: Right;  . CESAREAN SECTION    . PORTACATH  PLACEMENT Left 01/21/2015   Procedure: INSERTION PORT-A-CATH;  Surgeon: Stark Klein, MD;  Location: [place];  Service: General;  Laterality: Left;  . TUBAL LIGATION       MEDICATIONS:  Prior to Admission medications   Medication Sig Start Date End Date Taking? Authorizing Provider  calcium carbonate (OS-CAL - DOSED IN MG OF ELEMENTAL CALCIUM) 1250 (500 CA) MG tablet Take 1 tablet by mouth.   Yes Historical Provider, MD  cholecalciferol (VITAMIN D) 1000 UNITS tablet Take 6,000 Units by mouth daily.   Yes Historical Provider, MD  cyclobenzaprine (FLEXERIL) 10 MG tablet Take 10 mg by mouth 2 (two) times daily as needed for muscle spasms (fibrolmyalgia).   Yes Historical Provider, MD  levothyroxine (SYNTHROID, LEVOTHROID) 112 MCG tablet Take 112 mcg by mouth daily.   Yes Historical Provider, MD  magnesium oxide (MAG-OX) 400 MG tablet Take 400 mg by mouth daily.   Yes Historical Provider, MD  SUMAtriptan (IMITREX) 50 MG tablet Take 50 mg by mouth every 2 (two) hours as needed for migraine. May repeat in 2 hours if headache persists or recurs.   Yes Historical Provider, MD  traMADol (ULTRAM-ER) 100 MG 24 hr tablet Take 100 mg by mouth 2 (two) times daily.   Yes Historical Provider, MD     ALLERGIES:  Allergies  Allergen Reactions  . Eggs Or Egg-Derived Products Other (See Comments)    Severe pain In stomach area stomach swells up  . Penicillins Hives  . Penicillin G Rash     SOCIAL HISTORY:  Social History   Social History  .  Marital status: Married    Spouse name: N/A  . Number of children: N/A  . Years of education: N/A   Occupational History  . Not on file.   Social History Main Topics  . Smoking status: Former Smoker    Types: Cigarettes  . Smokeless tobacco: Never Used  . Alcohol use Yes     Comment: social  . Drug use: No  . Sexual activity: Yes    Birth control/ protection: None   Other Topics Concern  . Not on file   Social History Narrative  .  No narrative on file    The patient currently resides (home / rehab facility / nursing home): Home  The patient normally is (ambulatory / bedbound): Ambulatory   FAMILY HISTORY:  History reviewed. No pertinent family history.   REVIEW OF SYSTEMS:  Constitutional: denies weight loss, fever, chills, or sweats  Eyes: denies any other vision changes, history of eye injury  ENT: denies sore throat, hearing problems  Respiratory: denies shortness of breath, wheezing  Cardiovascular: denies chest pain, palpitations  Gastrointestinal: abdominal pain, N/V, and bowel function as per HPI Genitourinary: denies burning with urination or urinary frequency Musculoskeletal: denies any other joint pains or cramps  Skin: denies any other rashes or skin discolorations  Neurological: denies any other headache, dizziness, weakness  Psychiatric: denies any other depression, anxiety   All other review of systems were negative   VITAL SIGNS:  Temp:  [97.5 F (36.4 C)-98.5 F (36.9 C)] 98.5 F (36.9 C) (12/23 2100) Pulse Rate:  [60-68] 66 (12/23 2100) Resp:  [11-22] 18 (12/23 1644) BP: (122-149)/(63-80) 123/63 (12/23 2100) SpO2:  [96 %-100 %] 96 % (12/23 2100) Weight:  [89.3 kg (196 lb 14.4 oz)-89.8 kg (198 lb)] 89.3 kg (196 lb 14.4 oz) (12/23 1644)     Height: 5\' 4"  (162.6 cm) Weight: 89.3 kg (196 lb 14.4 oz) BMI (Calculated): 33.9   INTAKE/OUTPUT:  This shift: No intake/output data recorded.  Last 2 shifts: @IOLAST2SHIFTS @   PHYSICAL EXAM:  Constitutional:  -- Overweight body habitus  -- Awake, alert, and oriented x3  Eyes:  -- Pupils equally round and reactive to light  -- No scleral icterus  Ear, nose, and throat:  -- No jugular venous distension  Pulmonary:  -- No crackles  -- Equal breath sounds bilaterally -- Breathing non-labored at rest Cardiovascular:  -- S1, S2 present  -- No pericardial rubs Gastrointestinal:  -- Abdomen soft, nontender, nondistended, no guarding/rebound   -- No abdominal masses appreciated, pulsatile or otherwise  Musculoskeletal and Integumentary:  -- Wounds or skin discoloration: None appreciated -- Extremities: B/L UE and LE FROM, hands and feet warm  Neurologic:  -- Motor function: intact and symmetric -- Sensation: intact and symmetric  Labs:  CBC    Component Value Date/Time   WBC 7.2 02/06/2016 0859   RBC 4.89 02/06/2016 0859   HGB 14.2 02/06/2016 0859   HCT 41.8 02/06/2016 0859   PLT 231 02/06/2016 0859   MCV 85.5 02/06/2016 0859   MCH 29.0 02/06/2016 0859   MCHC 34.0 02/06/2016 0859   RDW 13.0 02/06/2016 0859   LYMPHSABS 0.7 02/06/2016 0859   MONOABS 0.6 02/06/2016 0859   EOSABS 0.1 02/06/2016 0859   BASOSABS 0.0 02/06/2016 0859   CMP Latest Ref Rng & Units 02/06/2016 01/26/2016 10/27/2015  Glucose 65 - 99 mg/dL 123(H) 99 92  BUN 6 - 20 mg/dL 14 9 13   Creatinine 0.44 - 1.00 mg/dL 0.84 0.81 0.85  Sodium  135 - 145 mmol/L 139 137 139  Potassium 3.5 - 5.1 mmol/L 3.7 3.4(L) 3.7  Chloride 101 - 111 mmol/L 106 104 104  CO2 22 - 32 mmol/L 23 25 26   Calcium 8.9 - 10.3 mg/dL 9.6 9.1 9.5  Total Protein 6.5 - 8.1 g/dL 7.7 7.0 7.3  Total Bilirubin 0.3 - 1.2 mg/dL 1.9(H) 1.4(H) 0.8  Alkaline Phos 38 - 126 U/L 121 60 53  AST 15 - 41 U/L 884(H) 29 28  ALT 14 - 54 U/L 370(H) 27 29   Imaging studies:  Abdominal Ultrasound (02/06/2016) The gallbladder is normally distended. Layering sludge is seen. 1 cm mobile calculus is also seen. The gallbladder wall is slightly thickened to 3.3 mm. Sonographic Murphy's sign was reported as negative. Common bile duct diameter measures 3.3 mm.  There is diffusely increased echogenicity of the liver. In the  dome of the right lobe of the liver there is a subcapsular 0.9  x 1.0 x 0.7 cm indeterminate hypoechoic mass.  CT Abdomen and Pelvis (02/06/2016) Fatty infiltration of liver with mild focal sparing adjacent  to gallbladder fossa. Dependent calcified gallstones within gallbladder.  Subcapsular low-attenuation nodule lateral RIGHT lobe liver 11 x 11 x 8 mm, likely corresponding to the ultrasound finding,  unchanged from 02/24/2015 exam. This lesion measured 9 x 9 x 11  mm on 09/02/2009, overall demonstrating little change over 6+ years,  likely benign. No acute intra-abdominal or intrapelvic abnormalities.  Assessment/Plan: (ICD-10's: K21.9, K80.20) 55 y.o. female with resolved epigastric abdominal pain and severe persistent nausea and heartburn, attributable to GERD +/- biliary etiology +/- acute/subacute hepatitis (chemotherapy-associated etiology alone less likely considering last chemo [id number]) and complicated by pertinent comorbidities including obesity (BMI 34), GERD, Right breast infiltrating ductal carcinoma, generalized anxiety disorder, Graves disease, and seizure disorder.   - PPI, symptomatic relief, supportive care  - follow-up hepatitis panel and repeat morning LFT's  - follow-up outpatient as needed if morning LFT's okay  - along with PPI, patient advised avoid fatty foods (meat, cheese/dairy, or fried) and may gradually reintroduce  - consider GI consultation if abnormal LFT's or symptoms recur  - medical management of comorbidities  - will signoff, please call if questions  - DVT prophylaxis  All of the above findings and recommendations were discussed with the patient, and all of patient's questions were answered to her expressed satisfaction.  Thank you for the opportunity to participate in this patient's care.  -- Marilynne Drivers Rosana Hoes, MD, [place]: [facility] Office: [phone]

## 2016-02-07 DIAGNOSIS — C50911 Malignant neoplasm of unspecified site of right female breast: Secondary | ICD-10-CM | POA: Diagnosis not present

## 2016-02-07 DIAGNOSIS — E039 Hypothyroidism, unspecified: Secondary | ICD-10-CM | POA: Diagnosis not present

## 2016-02-07 DIAGNOSIS — K759 Inflammatory liver disease, unspecified: Secondary | ICD-10-CM | POA: Diagnosis not present

## 2016-02-07 DIAGNOSIS — R7989 Other specified abnormal findings of blood chemistry: Secondary | ICD-10-CM | POA: Diagnosis not present

## 2016-02-07 LAB — HEPATIC FUNCTION PANEL
ALBUMIN: 3.6 g/dL (ref 3.5–5.0)
ALT: 691 U/L — AB (ref 14–54)
AST: 554 U/L — ABNORMAL HIGH (ref 15–41)
Alkaline Phosphatase: 118 U/L (ref 38–126)
BILIRUBIN INDIRECT: 2 mg/dL — AB (ref 0.3–0.9)
Bilirubin, Direct: 1.3 mg/dL — ABNORMAL HIGH (ref 0.1–0.5)
TOTAL PROTEIN: 6.6 g/dL (ref 6.5–8.1)
Total Bilirubin: 3.3 mg/dL — ABNORMAL HIGH (ref 0.3–1.2)

## 2016-02-07 LAB — HEPATITIS PANEL, ACUTE
HCV Ab: <0.1 s/co ratio (ref 0.0–0.9)
HEP A IGM: NEGATIVE
Hep B C IgM: NEGATIVE
Hepatitis B Surface Ag: NEGATIVE

## 2016-02-07 LAB — COMPREHENSIVE METABOLIC PANEL
ALT: 807 U/L — ABNORMAL HIGH (ref 14–54)
AST: 888 U/L — ABNORMAL HIGH (ref 15–41)
Albumin: 3.5 g/dL (ref 3.5–5.0)
Alkaline Phosphatase: 121 U/L (ref 38–126)
Anion gap: 5 (ref 5–15)
BILIRUBIN TOTAL: 3.4 mg/dL — AB (ref 0.3–1.2)
BUN: 13 mg/dL (ref 6–20)
CHLORIDE: 107 mmol/L (ref 101–111)
CO2: 26 mmol/L (ref 22–32)
CREATININE: 0.81 mg/dL (ref 0.44–1.00)
Calcium: 8.2 mg/dL — ABNORMAL LOW (ref 8.9–10.3)
Glucose, Bld: 93 mg/dL (ref 65–99)
POTASSIUM: 3.9 mmol/L (ref 3.5–5.1)
Sodium: 138 mmol/L (ref 135–145)
TOTAL PROTEIN: 6.3 g/dL — AB (ref 6.5–8.1)

## 2016-02-07 MED ORDER — PROMETHAZINE HCL 12.5 MG PO TABS: 12.5000 mg | ORAL_TABLET | Freq: Four times a day (QID) | ORAL | 0 refills | Status: AC | PRN

## 2016-02-07 MED ORDER — PANTOPRAZOLE SODIUM 40 MG PO TBEC: 40.0000 mg | DELAYED_RELEASE_TABLET | Freq: Two times a day (BID) | ORAL | 0 refills | Status: DC

## 2016-02-07 MED ORDER — SUMATRIPTAN SUCCINATE 50 MG PO TABS
100.0000 mg | ORAL_TABLET | Freq: Once | ORAL | Status: AC
  Administered 2016-02-07: 100 mg via ORAL
  Filled 2016-02-07: qty 2

## 2016-02-07 MED ORDER — OXYCODONE HCL 5 MG PO TABS: 5.0000 mg | ORAL_TABLET | Freq: Four times a day (QID) | ORAL | 0 refills | Status: DC | PRN

## 2016-02-07 MED ORDER — SUMATRIPTAN SUCCINATE 50 MG PO TABS
50.0000 mg | ORAL_TABLET | Freq: Once | ORAL | Status: AC
  Administered 2016-02-07: 50 mg via ORAL
  Filled 2016-02-07: qty 1

## 2016-02-07 MED ORDER — PROMETHAZINE HCL 12.5 MG PO TABS: 12.5000 mg | ORAL_TABLET | Freq: Four times a day (QID) | ORAL | 0 refills | Status: DC | PRN

## 2016-02-07 NOTE — Progress Notes (Signed)
Pt discharged home today per Dr. Memon. Pt's IV site D/C'd and WDL. Pt's VSS. Pt provided with home medication list, discharge instructions and prescriptions. Verbalized understanding. Pt left floor via WC in stable condition accompanied by NT.  

## 2016-02-07 NOTE — Progress Notes (Addendum)
Pt c/o migraine this morning without relief from Tramadol.  Dr. Roderic Palau paged and an order for Imitrex requested.  Will continue to monitor.

## 2016-02-07 NOTE — Progress Notes (Signed)
Patient ID: Tara Mcfarland, female   DOB: Feb 01, 1961, 55 y.o.   MRN: [id number] The patient requested the staff to get an Imitrex 50 mg tablet for migraine headache, which she usually takes at home for this. Order for it placed.  Tennis Must, MD

## 2016-02-07 NOTE — Discharge Instructions (Signed)
Cholecystitis °Cholecystitis is inflammation of the gallbladder. It is often called a gallbladder attack. The gallbladder is a pear-shaped organ that lies beneath the liver on the right side of the body. The gallbladder stores bile, which is a fluid that helps the body to digest fats. If bile builds up in your gallbladder, your gallbladder becomes inflamed. This condition may occur suddenly (be acute). Repeat episodes of acute cholecystitis or prolonged episodes may lead to a long-term (chronic) condition. Cholecystitis is serious and it requires treatment. °What are the causes? °The most common cause of this condition is gallstones. Gallstones can block the tube (duct) that carries bile out of your gallbladder. This causes bile to build up. Other causes of this condition include: °· Damage to the gallbladder due to a decrease in blood flow. °· Infections in the bile ducts. °· Scars or kinks in the bile ducts. °· Tumors in the liver, pancreas, or gallbladder. °What increases the risk? °This condition is more likely to develop in: °· People who have sickle cell disease. °· People who take birth control pills or use estrogen. °· People who have alcoholic liver disease. °· People who have liver cirrhosis. °· People who have their nutrition delivered through a vein (parenteral nutrition). °· People who do not eat or drink (do fasting) for a long period of time. °· People who are obese. °· People who have rapid weight loss. °· People who are pregnant. °· People who have increased triglyceride levels. °· People who have pancreatitis. °What are the signs or symptoms? °Symptoms of this condition include: °· Abdominal pain, especially in the upper right area of the abdomen. °· Abdominal tenderness or bloating. °· Nausea. °· Vomiting. °· Fever. °· Chills. °· Yellowing of the skin and the whites of the eyes (jaundice). °How is this diagnosed? °This condition is diagnosed with a medical history and physical exam. You may also  have other tests, including: °· Imaging tests, such as: °¨ An ultrasound of the gallbladder. °¨ A CT scan of the abdomen. °¨ A gallbladder nuclear scan (HIDA scan). This scan allows your health care provider to see the bile moving from your liver to your gallbladder and to your small intestine. °¨ MRI. °· Blood tests, such as: °¨ A complete blood count, because the white blood cell count may be higher than normal. °¨ Liver function tests, because some levels may be higher than normal with certain types of gallstones. °How is this treated? °Treatment may include: °· Fasting for a certain amount of time. °· IV fluids. °· Medicine to treat pain or vomiting. °· Antibiotic medicine. °· Surgery to remove your gallbladder (cholecystectomy). This may happen immediately or at a later time. °Follow these instructions at home: °Home care will depend on your treatment. In general: °· Take over-the-counter and prescription medicines only as told by your health care provider. °· If you were prescribed an antibiotic medicine, take it as told by your health care provider. Do not stop taking the antibiotic even if you start to feel better. °· Follow instructions from your health care provider about what to eat or drink. When you are allowed to eat, avoid eating or drinking anything that triggers your symptoms. °· Keep all follow-up visits as told by your health care provider. This is important. °Contact a health care provider if: °· Your pain is not controlled with medicine. °· You have a fever. °Get help right away if: °· Your pain moves to another part of your abdomen or to your   back. °· You continue to have symptoms or you develop new symptoms even with treatment. °This information is not intended to replace advice given to you by your health care provider. Make sure you discuss any questions you have with your health care provider. °Document Released: 01/31/2005 Document Revised: 06/11/2015 Document Reviewed:  05/14/2014 °Elsevier Interactive Patient Education © 2017 [employer] ° °

## 2016-02-07 NOTE — Discharge Summary (Signed)
Physician Discharge Summary  Tara Mcfarland [id number] DOB: [birth year] DOA: 02/06/2016  PCP: [name], PA-C  Admit date: 02/06/2016 Discharge date: 02/07/2016  Admitted From: home Disposition:  home  Recommendations for Outpatient Follow-up:  1. Follow up with PCP in 1-2 weeks 2. Repeat liver function tests on follow up with general surgery 3. Follow up with general surgery on 12/28  Home Health:  Equipment/Devices:   Discharge Condition: stable CODE STATUS: full Diet recommendation: Heart Healthy    Brief/Interim Summary: Tara Mcfarland is a 55 y.o. female with medical history significant of hypothyroidism, GERD, breast cancer, presents to the hospital with complaints of epigastric discomfort. She reports onset of symptoms occurring at 1 AM this morning and woke her up from sleep. Abdominal discomfort is located in the epigastrium, radiates through to her back and laterally around her trunk. She had nausea but no significant vomiting. She did have 4-5 episodes of diarrhea yesterday, but they have since resolved. No fever. Symptoms persisted until she came to the emergency room and received pain medications. She has not had any shortness of breath, chest pain, dysuria or other symptoms.  Discharge Diagnoses:  Principal Problem:   Hepatitis Active Problems:   Cancer of right breast, stage 2 (HCC)   Hypothyroidism   Elevated LFTs   Epigastric pain  Elevated liver enzymes. Viral Hepatitis panel negative. Abdominal imaging showed cholelithiasis. I suspect she may have passed a stone, since once wasn't seen in ducts on imaging. Transaminases are still elevated, but have mildly trended down this morning. Bilirubin is unchanged from this morning, but will likely trend down after transaminases, since it usually lags behind. She is not having any vomiting or abdominal pain and was able to eat a heart healthy diet. She is very insistent on being discharged home today. Since  LFTs are slowly improving and she is clinically better, will plan on discharge home today. Discussed with Dr. Arnoldo Morale who agrees. She was advised to continue on heart healthy diet. She was also advised to return to ED if she had any recurrence of abdominal pain, vomiting or developed fevers/chills. Patient understands and is anxious to discharge. She will follow up with general surgery later this week.  Hypothyroidism. Continue Synthroid  History of breast cancer. Continue to follow with oncology.  Discharge Instructions  Discharge Instructions    Diet - low sodium heart healthy    Complete by:  As directed    Increase activity slowly    Complete by:  As directed      Allergies as of 02/07/2016      Reactions   Eggs Or Egg-derived Products Other (See Comments)   Severe pain In stomach area stomach swells up   Penicillins Hives   Penicillin G Rash      Medication List    TAKE these medications   calcium carbonate 1250 (500 Ca) MG tablet Commonly known as:  OS-CAL - dosed in mg of elemental calcium Take 1 tablet by mouth.   cholecalciferol 1000 units tablet Commonly known as:  VITAMIN D Take 6,000 Units by mouth daily.   cyclobenzaprine 10 MG tablet Commonly known as:  FLEXERIL Take 10 mg by mouth 2 (two) times daily as needed for muscle spasms (fibrolmyalgia).   levothyroxine 112 MCG tablet Commonly known as:  SYNTHROID, LEVOTHROID Take 112 mcg by mouth daily.   magnesium oxide 400 MG tablet Commonly known as:  MAG-OX Take 400 mg by mouth daily.   oxyCODONE 5 MG immediate release tablet Commonly  known as:  ROXICODONE Take 1 tablet (5 mg total) by mouth every 6 (six) hours as needed for severe pain.   pantoprazole 40 MG tablet Commonly known as:  PROTONIX Take 1 tablet (40 mg total) by mouth 2 (two) times daily before a meal.   promethazine 12.5 MG tablet Commonly known as:  PHENERGAN Take 1 tablet (12.5 mg total) by mouth every 6 (six) hours as needed for  nausea or vomiting.   SUMAtriptan 50 MG tablet Commonly known as:  IMITREX Take 50 mg by mouth every 2 (two) hours as needed for migraine. May repeat in 2 hours if headache persists or recurs.   traMADol 100 MG 24 hr tablet Commonly known as:  ULTRAM-ER Take 100 mg by mouth 2 (two) times daily.      Follow-up Information    Jamesetta So, MD. Schedule an appointment as soon as possible for a visit on 02/11/2016.   Specialty:  General Surgery Contact information: [id number] [place] [id number] [phone]          Allergies  Allergen Reactions  . Eggs Or Egg-Derived Products Other (See Comments)    Severe pain In stomach area stomach swells up  . Penicillins Hives  . Penicillin G Rash    Consultations:  General surgery   Procedures/Studies: US Abdomen Complete  Result Date: 02/06/2016 CLINICAL DATA:  Epigastric pain, nausea vomiting. History of right breast cancer, diagnosed in 2016. EXAM: ABDOMEN ULTRASOUND COMPLETE COMPARISON:  None. FINDINGS: Gallbladder: The gallbladder is normally distended. Layering sludge is seen. 1 cm mobile calculus is also seen. The gallbladder wall is slightly thickened to 3.3 mm. Sonographic Murphy's sign was reported as negative. Common bile duct: Diameter: 3.3 mm. Liver: Diffusely increased echogenicity of the liver. In the dome of the right lobe of the liver there is a subcapsular 0.9 x 1.0 x 0.7 cm hypoechoic mass. IVC: No abnormality visualized. Pancreas: Visualized portion unremarkable. Spleen: Size and appearance within normal limits. Right Kidney: Length: 11.1. Echogenicity within normal limits. No mass or hydronephrosis visualized. Left Kidney: Length: 10.9. Echogenicity within normal limits. No mass or hydronephrosis visualized. Abdominal aorta: No aneurysm visualized. Other findings: None. IMPRESSION: Gallbladder sludge with borderline gallbladder wall thickening, which may be seen in early acute cholecystitis. The  sonographic Murphy's sign however was negative, which argues against acute cholecystitis, unless the patient was medicated. Diffusely increased echogenicity of the liver which may be seen with hepatic steatosis. Indeterminate 1 cm hypoechoic subcapsular right lobe of the liver lesion. Further evaluation with MRI or CT, liver protocol, may be considered, as metastatic lesion cannot be excluded. Electronically Signed   By: Fidela Salisbury M.D.   On: 02/06/2016 10:48   Ct Abdomen Pelvis W Contrast  Result Date: 02/06/2016 CLINICAL DATA:  Upper abdominal pain radiating to back and to lower abdomen with associated nausea, patient had diarrhea yesterday, sharp stabbing pain, RIGHT lobe liver mass on ultrasound, history RIGHT breast cancer post radiation therapy and chemotherapy EXAM: CT ABDOMEN AND PELVIS WITH CONTRAST TECHNIQUE: Multidetector CT imaging of the abdomen and pelvis was performed using the standard protocol following bolus administration of intravenous contrast. Sagittal and coronal MPR images reconstructed from axial data set. CONTRAST:  141mL ISOVUE-300 IOPAMIDOL (ISOVUE-300) INJECTION 61% IV. Dilute oral contrast. COMPARISON:  02/24/2015 FINDINGS: Lower chest: Minimal subpleural scarring at anterolateral RIGHT mid lung likely related to prior chest wall radiation therapy. Lung bases otherwise clear. Hepatobiliary: Fatty infiltration of liver with mild focal sparing adjacent to gallbladder fossa. Dependent  calcified gallstones within gallbladder. Subcapsular low-attenuation nodule lateral RIGHT lobe liver 11 x 11 x 8 mm, likely corresponding to the ultrasound finding, unchanged from 02/24/2015 exam. This lesion measured 9 x 9 x 11 mm on 09/02/2009, overall demonstrating little changed over 6+ years. Pancreas: Normal appearance Spleen: Normal appearance. Probable tiny splenules in LEFT upper quadrant. Adrenals/Urinary Tract: Adrenal glands normal appearance. Small RIGHT renal cysts. Kidneys,  kidneys, ureters, and bladder otherwise normal appearance. Stomach/Bowel: Minimal colonic diverticulosis. Normal appendix. Stomach and small bowel loops normal appearance. Vascular/Lymphatic: Aorta normal caliber.  No adenopathy. Reproductive: Unremarkable uterus and ovaries Other: No free air or free fluid.  No hernia. Musculoskeletal: Bones demineralized. IMPRESSION: Fatty infiltration of liver with a small low-attenuation nodule a tthe lateral aspect of the RIGHT lobe of the liver, 11 x 11 x 8 mm, little changed from the 11 x 9 x 9 mm size in 2011, likely benign. Minimal colonic diverticulosis. No acute intra-abdominal or intrapelvic abnormalities. Electronically Signed   By: Lavonia Dana M.D.   On: 02/06/2016 13:23   Dg Bone Density  Result Date: 02/01/2016 EXAM: DUAL X-RAY ABSORPTIOMETRY (DXA) FOR BONE MINERAL DENSITY IMPRESSION: Ordering Physician:  Dr. Baird Cancer, Your patient Armando Gang completed a BMD test on 02/01/2016 using the [place] (software version: 14.10) manufactured by [employer]. The following summarizes the results of our evaluation. PATIENT BIOGRAPHICAL: Name: DEMARI, BIRKS Patient ID: [id number] Birth Date: Mar 13, 1960 Height: 64.0 in. Gender: Female Exam Date: 02/01/2016 Weight: 198.0 lbs. Indications: Caucasian, Chronic Steroid Use, Follow up Osteoporosis, Height Loss, History of Fracture (Adult), Hx Breast Ca, Hyperthyroid, Post Menopausal, Secondary Osteoporosis, Glucocorticoids (Chronic) Fractures: Elbow, T-Spine Treatments: Calcium, Synthroid, Vitamin D DENSITOMETRY RESULTS: Site      Region     Measured Date Measured Age WHO Classification Young Adult T-score BMD         %Change vs. Previous Significant Change (*) AP Spine L1-L2 02/01/2016 54.9 Osteopenia -1.1 1.027 g/cm2 - - DualFemur Neck Right 02/01/2016 54.9 Osteopenia -2.0 0.764 g/cm2 - - ASSESSMENT: BMD as determined from Femur Neck Right is 0.764 g/cm2 with a T-Score of -2.0.  This patient is considered osteopenic according to [facility]) criteria. (L-3 and L-4 were excluded due to advanced degenerative changes.) World Health Organization [facility]) criteria for post-menopausal, Caucasian Women: Normal:       T-score at or above -1 SD Osteopenia:   T-score between -1 and -2.5 SD Osteoporosis: T-score at or below -2.5 SD RECOMMENDATIONS: [place] recommends that FDA-approved medial therapies be considered in postmenopausal women and men age 64 or older with a: 1. Hip or vertebral (clinical or morphometric) fracture. 2. T-Score of < -2.5 at the spine or hip. 3. Ten-year fracture probability by FRAX of 3% or greater for hip fracture or 20% or greater for major osteoporotic fracture. All treatment decisions require clinical judgment and consideration of individual patient factors, including patient preferences, co-morbidities, previous drug use, risk factors not captured in the FRAX model (e.g. falls, vitamin D deficiency, increased bone turnover, interval significant decline in bone density) and possible under-or over-estimation of fracture risk by FRAX. All patients should ensure an adequate intake of dietary calcium (1200 mg/d) and vitamin D (800 IU daily) unless contraindicated. FOLLOW-UP: People with diagnosed cases of osteoporosis or osteopenia should be regularly tested for bone mineral density. For patients eligible for Medicare, routine testing is allowed once every 2 years. Testing frequency can be increased for patients who have  rapidly progressing disease, or for those who are receiving medical therapy to restore bone mass. I have reviewed this report, and agree with the above findings. [facility] Dear Dr. Baird Cancer, Your patient Michaelle Birks Calder-HAYES completed a FRAX assessment on 02/01/2016 using the [place] (analysis version: 14.10) manufactured by [employer]. The following summarizes the results of  our evaluation. PATIENT BIOGRAPHICAL: Name: JODELL, CICCOLELLA Patient ID: [id number] Birth Date: 04-11-1960 Height:    64.0 in. Gender:     Female    [age]       Weight:    198.0 lbs. Ethnicity:  White                            Exam Date: 02/01/2016 FRAX* RESULTS:  (version: 3.5) 10-year Probability of Fracture1 Major Osteoporotic Fracture2 Hip Fracture 20.3% 3.0% Population: [place] (Caucasian) Risk Factors: History of Fracture (Adult), Secondary Osteoporosis, Glucocorticoids (Chronic) Based on Femur (Right) Neck BMD 1 -The 10-year probability of fracture may be lower than reported if the patient has received treatment. 2 -Major Osteoporotic Fracture: Clinical Spine, Forearm, Hip or Shoulder *FRAX is a Materials engineer of the [employer] for Metabolic Bone Disease, a [place] (WHO) [facility]. ASSESSMENT: The probability of a major osteoporotic fracture is 20.3% within the next ten years. The probability of a hip fracture is 3.0% within the next ten years. Electronically Signed   By: Lowella Grip III M.D.   On: 02/01/2016 16:02        Subjective: Feeling better. No vomiting, no abdominal pain  Discharge Exam: Vitals:   02/07/16 0608 02/07/16 1402  BP: (!) 115/54 133/66  Pulse: 76 74  Resp: 18 16  Temp: 98.3 F (36.8 C) 98.3 F (36.8 C)   Vitals:   02/06/16 1644 02/06/16 2100 02/07/16 0608 02/07/16 1402  BP: 133/68 123/63 (!) 115/54 133/66  Pulse: 63 66 76 74  Resp: 18  18 16   Temp: 98.2 F (36.8 C) 98.5 F (36.9 C) 98.3 F (36.8 C) 98.3 F (36.8 C)  TempSrc: Oral Oral Oral Oral  SpO2: 100% 96% 96% 99%  Weight: 89.3 kg (196 lb 14.4 oz)     Height: 5\' 4"  (1.626 m)       General: Pt is alert, awake, not in acute distress Cardiovascular: RRR, S1/S2 +, no rubs, no gallops Respiratory: CTA bilaterally, no wheezing, no rhonchi Abdominal: Soft, NT, ND, bowel sounds + Extremities: no edema, no cyanosis    The  results of significant diagnostics from this hospitalization (including imaging, microbiology, ancillary and laboratory) are listed below for reference.     Microbiology: No results found for this or any previous visit (from the past 240 hour(s)).   Labs: BNP (last 3 results) No results for input(s): BNP in the last 8760 hours. Basic Metabolic Panel:  Recent Labs Lab 02/06/16 0859 02/07/16 0702  NA 139 138  K 3.7 3.9  CL 106 107  CO2 23 26  GLUCOSE 123* 93  BUN 14 13  CREATININE 0.84 0.81  CALCIUM 9.6 8.2*   Liver Function Tests:  Recent Labs Lab 02/06/16 0859 02/07/16 0702 02/07/16 1626  AST 884* 888* 554*  ALT 370* 807* 691*  ALKPHOS 121 121 118  BILITOT 1.9* 3.4* 3.3*  PROT 7.7 6.3* 6.6  ALBUMIN 4.3 3.5 3.6    Recent Labs Lab 02/06/16 0859  LIPASE 29   No results for input(s): AMMONIA in the last 168 hours. CBC:  Recent Labs Lab 02/06/16 0859  WBC 7.2  NEUTROABS 5.8  HGB 14.2  HCT 41.8  MCV 85.5  PLT 231   Cardiac Enzymes:  Recent Labs Lab 02/06/16 0905  TROPONINI <0.03   BNP: Invalid input(s): POCBNP CBG: No results for input(s): GLUCAP in the last 168 hours. D-Dimer No results for input(s): DDIMER in the last 72 hours. Hgb A1c No results for input(s): HGBA1C in the last 72 hours. Lipid Profile No results for input(s): CHOL, HDL, LDLCALC, TRIG, CHOLHDL, LDLDIRECT in the last 72 hours. Thyroid function studies No results for input(s): TSH, T4TOTAL, T3FREE, THYROIDAB in the last 72 hours.  Invalid input(s): FREET3 Anemia work up No results for input(s): VITAMINB12, FOLATE, FERRITIN, TIBC, IRON, RETICCTPCT in the last 72 hours. Urinalysis    Component Value Date/Time   COLORURINE YELLOW 02/06/2016 0900   APPEARANCEUR CLEAR 02/06/2016 0900   LABSPEC 1.021 02/06/2016 0900   PHURINE 6.0 02/06/2016 0900   GLUCOSEU NEGATIVE 02/06/2016 0900   HGBUR SMALL (A) 02/06/2016 0900   BILIRUBINUR NEGATIVE 02/06/2016 0900   KETONESUR NEGATIVE  02/06/2016 0900   PROTEINUR NEGATIVE 02/06/2016 0900   UROBILINOGEN 0.2 09/02/2009 0000   NITRITE NEGATIVE 02/06/2016 0900   LEUKOCYTESUR NEGATIVE 02/06/2016 0900   Sepsis Labs Invalid input(s): PROCALCITONIN,  WBC,  LACTICIDVEN Microbiology No results found for this or any previous visit (from the past 240 hour(s)).   Time coordinating discharge: Over 30 minutes  SIGNED:   Kathie Dike, MD  [facility] 02/07/2016, 5:29 PM Pager   If 7PM-7AM, please contact night-coverage [email or web address] Password [email or web address]

## 2016-02-07 NOTE — Progress Notes (Signed)
Subjective: Patient denies any significant abdominal pain or nausea. She is currently having a migraine headache.  Objective: Vital signs in last 24 hours: Temp:  [98.2 F (36.8 C)-98.5 F (36.9 C)] 98.3 F (36.8 C) (12/24 [id number]) Pulse Rate:  [63-76] 76 (12/24 0608) Resp:  [15-18] 18 (12/24 0608) BP: (115-133)/(54-68) 115/54 (12/24 0608) SpO2:  [96 %-100 %] 96 % (12/24 [id number]) Weight:  [89.3 kg (196 lb 14.4 oz)] 89.3 kg (196 lb 14.4 oz) (12/23 1644) Last BM Date: 02/05/16  Intake/Output from previous day: 12/23 0701 - 12/24 0700 In: 1723.3 [P.O.:240; I.V.:1483.3] Out: 500 [Urine:500] Intake/Output this shift: Total I/O In: 120 [P.O.:120] Out: -   General appearance: alert, cooperative and no distress GI: Soft with minimal tenderness in the right upper quadrant to palpation. No hepatosplenomegaly or masses noted. No rigidity is noted. Bowel sounds are present.  Lab Results:   Recent Labs  02/06/16 0859  WBC 7.2  HGB 14.2  HCT 41.8  PLT 231   BMET  Recent Labs  02/06/16 0859 02/07/16 0702  NA 139 138  K 3.7 3.9  CL 106 107  CO2 23 26  GLUCOSE 123* 93  BUN 14 13  CREATININE 0.84 0.81  CALCIUM 9.6 8.2*   PT/INR No results for input(s): LABPROT, INR in the last 72 hours.  Studies/Results: US Abdomen Complete  Result Date: 02/06/2016 CLINICAL DATA:  Epigastric pain, nausea vomiting. History of right breast cancer, diagnosed in 2016. EXAM: ABDOMEN ULTRASOUND COMPLETE COMPARISON:  None. FINDINGS: Gallbladder: The gallbladder is normally distended. Layering sludge is seen. 1 cm mobile calculus is also seen. The gallbladder wall is slightly thickened to 3.3 mm. Sonographic Murphy's sign was reported as negative. Common bile duct: Diameter: 3.3 mm. Liver: Diffusely increased echogenicity of the liver. In the dome of the right lobe of the liver there is a subcapsular 0.9 x 1.0 x 0.7 cm hypoechoic mass. IVC: No abnormality visualized. Pancreas: Visualized portion  unremarkable. Spleen: Size and appearance within normal limits. Right Kidney: Length: 11.1. Echogenicity within normal limits. No mass or hydronephrosis visualized. Left Kidney: Length: 10.9. Echogenicity within normal limits. No mass or hydronephrosis visualized. Abdominal aorta: No aneurysm visualized. Other findings: None. IMPRESSION: Gallbladder sludge with borderline gallbladder wall thickening, which may be seen in early acute cholecystitis. The sonographic Murphy's sign however was negative, which argues against acute cholecystitis, unless the patient was medicated. Diffusely increased echogenicity of the liver which may be seen with hepatic steatosis. Indeterminate 1 cm hypoechoic subcapsular right lobe of the liver lesion. Further evaluation with MRI or CT, liver protocol, may be considered, as metastatic lesion cannot be excluded. Electronically Signed   By: Fidela Salisbury M.D.   On: 02/06/2016 10:48   Ct Abdomen Pelvis W Contrast  Result Date: 02/06/2016 CLINICAL DATA:  Upper abdominal pain radiating to back and to lower abdomen with associated nausea, patient had diarrhea yesterday, sharp stabbing pain, RIGHT lobe liver mass on ultrasound, history RIGHT breast cancer post radiation therapy and chemotherapy EXAM: CT ABDOMEN AND PELVIS WITH CONTRAST TECHNIQUE: Multidetector CT imaging of the abdomen and pelvis was performed using the standard protocol following bolus administration of intravenous contrast. Sagittal and coronal MPR images reconstructed from axial data set. CONTRAST:  155mL ISOVUE-300 IOPAMIDOL (ISOVUE-300) INJECTION 61% IV. Dilute oral contrast. COMPARISON:  02/24/2015 FINDINGS: Lower chest: Minimal subpleural scarring at anterolateral RIGHT mid lung likely related to prior chest wall radiation therapy. Lung bases otherwise clear. Hepatobiliary: Fatty infiltration of liver with mild focal sparing adjacent to  gallbladder fossa. Dependent calcified gallstones within gallbladder.  Subcapsular low-attenuation nodule lateral RIGHT lobe liver 11 x 11 x 8 mm, likely corresponding to the ultrasound finding, unchanged from 02/24/2015 exam. This lesion measured 9 x 9 x 11 mm on 09/02/2009, overall demonstrating little changed over 6+ years. Pancreas: Normal appearance Spleen: Normal appearance. Probable tiny splenules in LEFT upper quadrant. Adrenals/Urinary Tract: Adrenal glands normal appearance. Small RIGHT renal cysts. Kidneys, kidneys, ureters, and bladder otherwise normal appearance. Stomach/Bowel: Minimal colonic diverticulosis. Normal appendix. Stomach and small bowel loops normal appearance. Vascular/Lymphatic: Aorta normal caliber.  No adenopathy. Reproductive: Unremarkable uterus and ovaries Other: No free air or free fluid.  No hernia. Musculoskeletal: Bones demineralized. IMPRESSION: Fatty infiltration of liver with a small low-attenuation nodule a tthe lateral aspect of the RIGHT lobe of the liver, 11 x 11 x 8 mm, little changed from the 11 x 9 x 9 mm size in 2011, likely benign. Minimal colonic diverticulosis. No acute intra-abdominal or intrapelvic abnormalities. Electronically Signed   By: Lavonia Dana M.D.   On: 02/06/2016 13:23    Anti-infectives: Anti-infectives    None      Assessment/Plan: Impression: Liver enzyme tests have worsened. Clinically, the patient denies any significant nausea or abdominal pain. She definitely may have early acute cholecystitis. There is no evidence of cholangitis yet. Ideally, I would want her liver enzyme test to improve prior to any surgical intervention. There is also question of choledocholithiasis. Patient is adamant that she wants to go home for Christmas. Should she do this, she was given strict instructions about returning to the emergency room should she develop fever, chills, or significant abdominal pain. She was also given my information for follow-up. This was discussed with Dr. Roderic Palau.  LOS: 0 days    Freeman Borba  A 02/07/2016

## 2016-02-08 ENCOUNTER — Encounter (HOSPITAL_COMMUNITY): Payer: Self-pay | Admitting: Emergency Medicine

## 2016-02-08 ENCOUNTER — Inpatient Hospital Stay (HOSPITAL_COMMUNITY)
Admission: EM | Admit: 2016-02-08 | Discharge: 2016-02-12 | DRG: 418 | Disposition: A | Discharge status: Home / Self Care | Payer: Medicaid Other | Attending: Internal Medicine | Admitting: Internal Medicine

## 2016-02-08 DIAGNOSIS — K219 Gastro-esophageal reflux disease without esophagitis: Secondary | ICD-10-CM | POA: Diagnosis present

## 2016-02-08 DIAGNOSIS — Z8261 Family history of arthritis: Secondary | ICD-10-CM | POA: Diagnosis not present

## 2016-02-08 DIAGNOSIS — E039 Hypothyroidism, unspecified: Secondary | ICD-10-CM | POA: Diagnosis present

## 2016-02-08 DIAGNOSIS — M328 Other forms of systemic lupus erythematosus: Secondary | ICD-10-CM

## 2016-02-08 DIAGNOSIS — R7989 Other specified abnormal findings of blood chemistry: Secondary | ICD-10-CM | POA: Diagnosis not present

## 2016-02-08 DIAGNOSIS — C50911 Malignant neoplasm of unspecified site of right female breast: Secondary | ICD-10-CM | POA: Diagnosis present

## 2016-02-08 DIAGNOSIS — Z6834 Body mass index (BMI) 34.0-34.9, adult: Secondary | ICD-10-CM

## 2016-02-08 DIAGNOSIS — K8062 Calculus of gallbladder and bile duct with acute cholecystitis without obstruction: Secondary | ICD-10-CM | POA: Diagnosis present

## 2016-02-08 DIAGNOSIS — M329 Systemic lupus erythematosus, unspecified: Secondary | ICD-10-CM | POA: Diagnosis present

## 2016-02-08 DIAGNOSIS — R1011 Right upper quadrant pain: Secondary | ICD-10-CM | POA: Diagnosis present

## 2016-02-08 DIAGNOSIS — B179 Acute viral hepatitis, unspecified: Secondary | ICD-10-CM | POA: Diagnosis present

## 2016-02-08 DIAGNOSIS — R945 Abnormal results of liver function studies: Secondary | ICD-10-CM

## 2016-02-08 DIAGNOSIS — K8050 Calculus of bile duct without cholangitis or cholecystitis without obstruction: Secondary | ICD-10-CM

## 2016-02-08 DIAGNOSIS — E038 Other specified hypothyroidism: Secondary | ICD-10-CM | POA: Diagnosis not present

## 2016-02-08 DIAGNOSIS — K810 Acute cholecystitis: Secondary | ICD-10-CM | POA: Diagnosis not present

## 2016-02-08 DIAGNOSIS — Z820 Family history of epilepsy and other diseases of the nervous system: Secondary | ICD-10-CM

## 2016-02-08 DIAGNOSIS — K759 Inflammatory liver disease, unspecified: Secondary | ICD-10-CM

## 2016-02-08 DIAGNOSIS — E876 Hypokalemia: Secondary | ICD-10-CM | POA: Diagnosis present

## 2016-02-08 DIAGNOSIS — R748 Abnormal levels of other serum enzymes: Secondary | ICD-10-CM

## 2016-02-08 DIAGNOSIS — Z8249 Family history of ischemic heart disease and other diseases of the circulatory system: Secondary | ICD-10-CM | POA: Diagnosis not present

## 2016-02-08 DIAGNOSIS — F411 Generalized anxiety disorder: Secondary | ICD-10-CM | POA: Diagnosis present

## 2016-02-08 DIAGNOSIS — M797 Fibromyalgia: Secondary | ICD-10-CM | POA: Diagnosis present

## 2016-02-08 DIAGNOSIS — Z833 Family history of diabetes mellitus: Secondary | ICD-10-CM

## 2016-02-08 DIAGNOSIS — E669 Obesity, unspecified: Secondary | ICD-10-CM | POA: Diagnosis present

## 2016-02-08 DIAGNOSIS — G40909 Epilepsy, unspecified, not intractable, without status epilepticus: Secondary | ICD-10-CM | POA: Diagnosis present

## 2016-02-08 DIAGNOSIS — Z87891 Personal history of nicotine dependence: Secondary | ICD-10-CM

## 2016-02-08 DIAGNOSIS — R109 Unspecified abdominal pain: Secondary | ICD-10-CM

## 2016-02-08 DIAGNOSIS — R110 Nausea: Secondary | ICD-10-CM

## 2016-02-08 HISTORY — DX: Migraine without aura, not intractable, without status migrainosus: G43.009

## 2016-02-08 HISTORY — DX: Systemic lupus erythematosus, unspecified: M32.9

## 2016-02-08 HISTORY — DX: Inflammatory liver disease, unspecified: K75.9

## 2016-02-08 LAB — COMPREHENSIVE METABOLIC PANEL
ALT: 515 U/L — ABNORMAL HIGH (ref 14–54)
ANION GAP: 11 (ref 5–15)
AST: 330 U/L — ABNORMAL HIGH (ref 15–41)
Albumin: 3.9 g/dL (ref 3.5–5.0)
Alkaline Phosphatase: 116 U/L (ref 38–126)
BILIRUBIN TOTAL: 3.1 mg/dL — AB (ref 0.3–1.2)
BUN: 9 mg/dL (ref 6–20)
CHLORIDE: 105 mmol/L (ref 101–111)
CO2: 22 mmol/L (ref 22–32)
Calcium: 8.9 mg/dL (ref 8.9–10.3)
Creatinine, Ser: 0.84 mg/dL (ref 0.44–1.00)
GFR calc Af Amer: >60 mL/min (ref >60)
Glucose, Bld: 111 mg/dL — ABNORMAL HIGH (ref 65–99)
POTASSIUM: 3.5 mmol/L (ref 3.5–5.1)
Sodium: 138 mmol/L (ref 135–145)
TOTAL PROTEIN: 6.8 g/dL (ref 6.5–8.1)

## 2016-02-08 LAB — CBC WITH DIFFERENTIAL/PLATELET
BASOS ABS: 0 10*3/uL (ref 0.0–0.1)
BASOS PCT: 0 %
EOS PCT: 10 %
Eosinophils Absolute: 0.5 10*3/uL (ref 0.0–0.7)
HEMATOCRIT: 39.6 % (ref 36.0–46.0)
Hemoglobin: 13.4 g/dL (ref 12.0–15.0)
Lymphocytes Relative: 22 %
Lymphs Abs: 1 10*3/uL (ref 0.7–4.0)
MCH: 29.3 pg (ref 26.0–34.0)
MCHC: 33.8 g/dL (ref 30.0–36.0)
MCV: 86.7 fL (ref 78.0–100.0)
MONO ABS: 0.3 10*3/uL (ref 0.1–1.0)
MONOS PCT: 7 %
NEUTROS ABS: 2.9 10*3/uL (ref 1.7–7.7)
Neutrophils Relative %: 61 %
PLATELETS: 179 10*3/uL (ref 150–400)
RBC: 4.57 MIL/uL (ref 3.87–5.11)
RDW: 13.3 % (ref 11.5–15.5)
WBC: 4.8 10*3/uL (ref 4.0–10.5)

## 2016-02-08 LAB — LIPASE, BLOOD: LIPASE: 26 U/L (ref 11–51)

## 2016-02-08 MED ORDER — TRAMADOL HCL 50 MG PO TABS
50.0000 mg | ORAL_TABLET | Freq: Four times a day (QID) | ORAL | Status: DC | PRN
  Administered 2016-02-12: 50 mg via ORAL
  Filled 2016-02-08: qty 1

## 2016-02-08 MED ORDER — MAGNESIUM OXIDE 400 (241.3 MG) MG PO TABS
400.0000 mg | ORAL_TABLET | Freq: Every day | ORAL | Status: DC
  Administered 2016-02-08 – 2016-02-12 (×4): 400 mg via ORAL
  Filled 2016-02-08 (×6): qty 1

## 2016-02-08 MED ORDER — PROCHLORPERAZINE EDISYLATE 5 MG/ML IJ SOLN
10.0000 mg | Freq: Four times a day (QID) | INTRAMUSCULAR | Status: DC | PRN
Start: 2016-02-08 — End: 2016-02-12

## 2016-02-08 MED ORDER — SODIUM CHLORIDE 0.9 % IV SOLN
1000.0000 mL | Freq: Once | INTRAVENOUS | Status: AC
  Administered 2016-02-08: 1000 mL via INTRAVENOUS

## 2016-02-08 MED ORDER — CIPROFLOXACIN IN D5W 400 MG/200ML IV SOLN
400.0000 mg | Freq: Two times a day (BID) | INTRAVENOUS | Status: DC
  Administered 2016-02-09 – 2016-02-11 (×6): 400 mg via INTRAVENOUS
  Filled 2016-02-08 (×6): qty 200

## 2016-02-08 MED ORDER — SODIUM CHLORIDE 0.9 % IV SOLN
1000.0000 mL | INTRAVENOUS | Status: DC
  Administered 2016-02-08: 1000 mL via INTRAVENOUS

## 2016-02-08 MED ORDER — BISACODYL 5 MG PO TBEC: 5.0000 mg | DELAYED_RELEASE_TABLET | Freq: Every day | ORAL | Status: DC | PRN

## 2016-02-08 MED ORDER — HYDROMORPHONE HCL 1 MG/ML IJ SOLN
1.0000 mg | INTRAMUSCULAR | Status: DC | PRN
  Administered 2016-02-08 – 2016-02-12 (×12): 1 mg via INTRAVENOUS
  Filled 2016-02-08 (×14): qty 1

## 2016-02-08 MED ORDER — SODIUM CHLORIDE 0.9 % IV SOLN
INTRAVENOUS | Status: DC
Start: 2016-02-08 — End: 2016-02-12
  Administered 2016-02-08 – 2016-02-12 (×4): via INTRAVENOUS

## 2016-02-08 MED ORDER — CIPROFLOXACIN IN D5W 400 MG/200ML IV SOLN
400.0000 mg | Freq: Once | INTRAVENOUS | Status: AC
  Administered 2016-02-08: 400 mg via INTRAVENOUS
  Filled 2016-02-08: qty 200

## 2016-02-08 MED ORDER — ENOXAPARIN SODIUM 40 MG/0.4ML ~~LOC~~ SOLN
40.0000 mg | SUBCUTANEOUS | Status: DC
  Administered 2016-02-10: 40 mg via SUBCUTANEOUS
  Filled 2016-02-08: qty 0.4

## 2016-02-08 MED ORDER — LEVOTHYROXINE SODIUM 112 MCG PO TABS
112.0000 ug | ORAL_TABLET | Freq: Every day | ORAL | Status: DC
  Administered 2016-02-09 – 2016-02-12 (×4): 112 ug via ORAL
  Filled 2016-02-08 (×4): qty 1

## 2016-02-08 MED ORDER — SUMATRIPTAN SUCCINATE 50 MG PO TABS
50.0000 mg | ORAL_TABLET | ORAL | Status: DC | PRN
  Administered 2016-02-09 – 2016-02-11 (×2): 50 mg via ORAL
  Filled 2016-02-08 (×2): qty 1

## 2016-02-08 MED ORDER — PROMETHAZINE HCL 25 MG/ML IJ SOLN
12.5000 mg | Freq: Four times a day (QID) | INTRAMUSCULAR | Status: DC | PRN
  Administered 2016-02-09 – 2016-02-11 (×5): 12.5 mg via INTRAVENOUS
  Filled 2016-02-08 (×6): qty 1

## 2016-02-08 MED ORDER — PANTOPRAZOLE SODIUM 40 MG PO TBEC
40.0000 mg | DELAYED_RELEASE_TABLET | Freq: Two times a day (BID) | ORAL | Status: DC
  Administered 2016-02-09 – 2016-02-12 (×6): 40 mg via ORAL
  Filled 2016-02-08 (×6): qty 1

## 2016-02-08 MED ORDER — ONDANSETRON HCL 4 MG/2ML IJ SOLN
4.0000 mg | Freq: Once | INTRAMUSCULAR | Status: AC
  Administered 2016-02-08: 4 mg via INTRAVENOUS
  Filled 2016-02-08: qty 2

## 2016-02-08 MED ORDER — MAGNESIUM HYDROXIDE 400 MG/5ML PO SUSP
30.0000 mL | Freq: Every day | ORAL | Status: DC | PRN
  Administered 2016-02-12: 30 mL via ORAL
  Filled 2016-02-08: qty 30

## 2016-02-08 MED ORDER — CYCLOBENZAPRINE HCL 10 MG PO TABS: 10.0000 mg | ORAL_TABLET | Freq: Two times a day (BID) | ORAL | Status: DC | PRN

## 2016-02-08 MED ORDER — HYDROMORPHONE HCL 1 MG/ML IJ SOLN
1.0000 mg | Freq: Once | INTRAMUSCULAR | Status: AC
Start: 2016-02-08 — End: 2016-02-08
  Administered 2016-02-08: 1 mg via INTRAVENOUS
  Filled 2016-02-08: qty 1

## 2016-02-08 MED ORDER — LEVOTHYROXINE SODIUM 75 MCG PO TABS
112.5000 ug | ORAL_TABLET | Freq: Every day | ORAL | Status: DC
  Filled 2016-02-08 (×2): qty 1

## 2016-02-08 NOTE — Assessment & Plan Note (Signed)
Severe elevation of AST and ALT. Bili also elevated. Slight improvement from 02/07/16 but still elevated. Plan: Follow CMP. Check INR. General surgery consult.

## 2016-02-08 NOTE — Assessment & Plan Note (Addendum)
Severe. Failed outpatient management. Plan: Requiring IV dilaudid, will continue PRN IV dilaudid. Surgical evaluation planned for the am.

## 2016-02-08 NOTE — ED Triage Notes (Signed)
Pt reports abdominal pain that radiates into her back. Pt states acute pain started about an hour ago. Pt c/o nausea. Pt seen here for same on 12/23,

## 2016-02-08 NOTE — Assessment & Plan Note (Addendum)
Suspected. No [place] service available on admission, 02/08/16. Reviewed CT and [place] from 02/06/16. Failed outpatient management of her abdominal pain. Plan: Emergency department consulted Dr. Arnoldo Morale, who recommends: Start IV cipro. NPO after midnight. May consider surgical intervention or ERCP tomorrow.

## 2016-02-08 NOTE — Assessment & Plan Note (Signed)
Rare flare ups. May increase risk for other autoimmune diagnoses. Plan: Monitor.

## 2016-02-08 NOTE — ED Provider Notes (Signed)
[facility] DEPT Provider Note   CSN: [id number] Arrival date & time: 02/08/16  1525     History   Chief Complaint Chief Complaint  Patient presents with  . Abdominal Pain    HPI Tinzlee Vandenbos is a 55 y.o. female.  HPI Patient presents with recurrent epigastric and right lower quadrant abdominal pain.  She was recently in the hospital and requested to go home yesterday for the holidays despite known elevated liver function tests and known gallbladder sludge and stones.  The liver function tests were improving while in the hospital.  She's never had common bile duct dilatation.  She went home yesterday for the holidays and presents back with worsening epigastric and right upper quadrant pain.  Her pain is moderate to severe in severity.  She's had ongoing nausea without vomiting.  She's had decreased oral intake.  No fevers.   Past Medical History:  Diagnosis Date  . Anxiety   . Cancer [facility])    right breast  . Cancer of right breast, stage 2 ([place]) 09/01/2015  . GERD (gastroesophageal reflux disease)   . Graves disease   . Headache   . Hepatitis   . Hypothyroidism   . Lupus (systemic lupus erythematosus) ([place])   . PONV (postoperative nausea and vomiting)   . Seizures [facility])     Patient Active Problem List   Diagnosis Date Noted  . Hepatitis 02/06/2016  . Hypothyroidism 02/06/2016  . Elevated LFTs 02/06/2016  . Epigastric pain   . Osteoporosis 09/04/2015  . Lupus (systemic lupus erythematosus) ([place]) 09/04/2015  . Cancer of right breast, stage 2 ([place]) 09/01/2015  . Dermatitis, drug-induced 07/22/2015  . Chronic midline back pain 07/08/2015  . Dermatitis 07/01/2015  . Neuropathy ([place]) 05/28/2015  . Chemotherapy-induced neutropenia ([place]) 04/01/2015  . Nausea 04/01/2015  . Encounter for antineoplastic chemotherapy 02/27/2015  . Port-a-cath in place 02/27/2015  . Long term current use of systemic steroids 02/11/2015  . History of cardiomegaly 12/25/2014  .  History of lupus 12/25/2014  . Depression 02/16/2010  . Fibromyalgia 02/16/2010  . Thyroid dysfunction 02/16/2010    Past Surgical History:  Procedure Laterality Date  . BREAST LUMPECTOMY WITH RADIOACTIVE SEED AND SENTINEL LYMPH NODE BIOPSY Right 01/21/2015   Procedure: RIGHT BREAST LUMPECTOMY WITH RADIOACTIVE SEED AND RIGHT SENTINEL LYMPH NODE BIOPSY;  Surgeon: Stark Klein, MD;  Location: [place];  Service: General;  Laterality: Right;  . CESAREAN SECTION    . PORTACATH PLACEMENT Left 01/21/2015   Procedure: INSERTION PORT-A-CATH;  Surgeon: Stark Klein, MD;  Location: [place];  Service: General;  Laterality: Left;  . TUBAL LIGATION      OB History    Gravida Para Term Preterm AB Living   3 3 3          SAB TAB Ectopic Multiple Live Births                   Home Medications    Prior to Admission medications   Medication Sig Start Date End Date Taking? Authorizing Provider  calcium carbonate (OS-CAL - DOSED IN MG OF ELEMENTAL CALCIUM) 1250 (500 CA) MG tablet Take 1 tablet by mouth daily.    Yes Historical Provider, MD  cholecalciferol (VITAMIN D) 1000 UNITS tablet Take 6,000 Units by mouth daily.   Yes Historical Provider, MD  levothyroxine (SYNTHROID, LEVOTHROID) 112 MCG tablet Take 112 mcg by mouth daily.   Yes Historical Provider, MD  magnesium oxide (MAG-OX) 400 MG tablet Take 400  mg by mouth daily.   Yes Historical Provider, MD  SUMAtriptan (IMITREX) 50 MG tablet Take 50 mg by mouth every 2 (two) hours as needed for migraine. May repeat in 2 hours if headache persists or recurs.   Yes Historical Provider, MD  traMADol (ULTRAM) 50 MG tablet Take 50 mg by mouth every 6 (six) hours as needed for moderate pain or severe pain.   Yes Historical Provider, MD  cyclobenzaprine (FLEXERIL) 10 MG tablet Take 10 mg by mouth 2 (two) times daily as needed for muscle spasms (fibrolmyalgia).    Historical Provider, MD  oxyCODONE (ROXICODONE) 5 MG immediate  release tablet Take 1 tablet (5 mg total) by mouth every 6 (six) hours as needed for severe pain. 02/07/16   Kathie Dike, MD  pantoprazole (PROTONIX) 40 MG tablet Take 1 tablet (40 mg total) by mouth 2 (two) times daily before a meal. 02/07/16   Kathie Dike, MD  promethazine (PHENERGAN) 12.5 MG tablet Take 1 tablet (12.5 mg total) by mouth every 6 (six) hours as needed for nausea or vomiting. 02/07/16   Kathie Dike, MD    Family History Family History  Problem Relation Age of Onset  . Alzheimer's disease Father   . Diabetes Other   . Hypertension Other     Social History Social History  Substance Use Topics  . Smoking status: Former Smoker    Types: Cigarettes  . Smokeless tobacco: Never Used  . Alcohol use Yes     Comment: social     Allergies   Eggs or egg-derived products; Penicillins; and Penicillin g   Review of Systems Review of Systems  All other systems reviewed and are negative.    Physical Exam Updated Vital Signs BP 122/75   Pulse 65   Temp 97.9 F (36.6 C) (Oral)   Resp 18   Ht 5\' 4"  (1.626 m)   SpO2 95%   Physical Exam  Constitutional: She is oriented to person, place, and time. She appears well-developed and well-nourished. No distress.  HENT:  Head: Normocephalic and atraumatic.  Eyes: EOM are normal.  Neck: Normal range of motion.  Cardiovascular: Normal rate, regular rhythm and normal heart sounds.   Pulmonary/Chest: Effort normal and breath sounds normal.  Abdominal: Soft. She exhibits no distension.  Moderate right upper quadrant tenderness without guarding or rebound.  Musculoskeletal: Normal range of motion.  Neurological: She is alert and oriented to person, place, and time.  Skin: Skin is warm and dry.  Psychiatric: She has a normal mood and affect. Judgment normal.  Nursing note and vitals reviewed.    ED Treatments / Results  Labs (all labs ordered are listed, but only abnormal results are displayed) Labs Reviewed    COMPREHENSIVE METABOLIC PANEL - Abnormal; Notable for the following:       Result Value   Glucose, Bld 111 (*)    AST 330 (*)    ALT 515 (*)    Total Bilirubin 3.1 (*)    All other components within normal limits  CBC WITH DIFFERENTIAL/PLATELET  LIPASE, BLOOD    EKG  EKG Interpretation None       Radiology No results found.  Procedures Procedures (including critical care time)  Medications Ordered in ED Medications  0.9 %  sodium chloride infusion (1,000 mLs Intravenous New Bag/Given 02/08/16 1554)    Followed by  0.9 %  sodium chloride infusion (1,000 mLs Intravenous New Bag/Given 02/08/16 1555)  levothyroxine (SYNTHROID, LEVOTHROID) tablet 112.5 mcg (not administered)  ciprofloxacin (CIPRO) IVPB 400 mg (not administered)  ondansetron (ZOFRAN) injection 4 mg (4 mg Intravenous Given 02/08/16 1554)  HYDROmorphone (DILAUDID) injection 1 mg (1 mg Intravenous Given 02/08/16 1554)     Initial Impression / Assessment and Plan / ED Course  I have reviewed the triage vital signs and the nursing notes.  Pertinent labs & imaging results that were available during my care of the patient were reviewed by me and considered in my medical decision making (see chart for details).  Clinical Course     I discussed the case with Dr. Arnoldo Morale of general surgery who requested the patient be nothing by mouth after midnight.  Based on her LFTs and examination tomorrow he may either take her gallbladder out or consider repeat imaging.  I do not think she needs acute imaging at this time.  He recommends that we begin 400 mg IV Cipro every 12 hours.  Her LFTs are improving.  Hospitalist admission.  Final Clinical Impressions(s) / ED Diagnoses   Final diagnoses:  Abdominal pain  RUQ abdominal pain  Elevated liver function tests    New Prescriptions New Prescriptions   No medications on file     Jola Schmidt, MD 02/08/16 1705

## 2016-02-08 NOTE — Assessment & Plan Note (Signed)
Diagnosed 12/2014. Followed by Dr. Whitney Muse. Had chemotherapy and radiation. Lumpectomy. Plan: Continue follow up with Heme/Onc.

## 2016-02-08 NOTE — Assessment & Plan Note (Addendum)
Failed outpatient management.  LFTs slowly decreasing but still quite elevated. Plan: Surgery consult. Follow CMP. Check direct bili. Consider GI consult if no improvement. Labs for viral hepatitis completed 02/06/16 and were negative. Has history of lupus. Will test for autoimmune hepatitis: ordered ANA, anti-smooth muscle Ab, and anti-mitochondrial Ab for the AM.

## 2016-02-08 NOTE — H&P (Signed)
History and Physical    Patient:   Tara Mcfarland [id number] 1960/08/30  Date of Admission: 02/08/2016  PRIMARY CARE PROVIDER (PCP): [name], PA-C  verified PCP with patient  Outpatient Specialists:  Dr. Arnoldo Morale, General Surgery Dr. Whitney Muse, Heme/Onc Rheumatologist, pt does not know name Dr. Sondra Come, [place]  Patient coming from: home  Chief Complaint: nausea and abdominal pain  HPI:  The patient is a 55 yo woman admitted 02/06/16 then discharged due to her strong request 02/07/16 who presents again today with abdominal symptoms. She had planned to follow up as an outpatient in a few days with Dr. Arnoldo Morale, but her symptoms were too severe so she presented to the emergency room today. Of note, when she left on 02/07/16, her symptoms were much improved; at the time of discharge, she was cautioned that she should stay in the hospital for further evaluation and treatment, but she refused, was crying and distraught, saying she had to go home for Christmas. Regarding her abdominal symptoms: Onset: Started 1 am on 02/06/16. Duration: intermittent at first. Now returned and worsening. Now constant. Timing: now lasting hours.  Severity: severe. Context: had workup during admission 02/06/16 - 02/07/16 that revealed no evidence of viral hepatitis. Location: Right upper quadrant and epigastric area.  Radiation: right back. Character/Quality: 10/10. "stabbing, and like someone hit me." It felt bruised.  Alleviated by: Nothing. Exacerbated by: lying down. Associated Symptoms: Nausea. No vomiting this time but was vomiting yesterday and day before.  Diarrhea on 02/05/16 but none since then. Yesterday, she thinks there was a small amount of blood, bright red, in the toilet bowl. Chills but no fever. Mild chest pain, left chest, has a port in the area, 4/10, intermittent, sharp in quality, no exacerbating or alleviating factors. Shortness of breath; hurts to breathe. No coughing or  wheezing.  Treatments: none at home except usual medications.  She states she does not know if she can have an MRI because she had a piece of an earring fall out and into her ear some time ago, and she is not sure if the piece ever got out of her right ear.   ED Course: Patient was evaluated in the emergency department. Her pain was severe and relieved only with IV dilaudid. ED physician discussed case with general surgeon, Dr. Arnoldo Morale, who recommended patient be admitted and be NPO after midnight for possible surgical intervention.   Past Medical History:  Diagnosis Date  . Anxiety   . Cancer [facility])    right breast  . Cancer of right breast, stage 2 ([place]) 12/2014   Stage 2. Right. Chemo and radiation. Lumpectomy and 4 LN removed.  Marland Kitchen GERD (gastroesophageal reflux disease)   . Graves disease   . Headache   . Hepatitis   . Hypothyroidism   . Lupus (systemic lupus erythematosus) ([place]) 1997   Rare flare ups manifest as a rash with weakness and arthritis.  . Migraine headache without aura    frequent  . PONV (postoperative nausea and vomiting)    DENIES history of seizures; seizures has been removed from her past medical history.   Past Surgical History:  Procedure Laterality Date  . BREAST LUMPECTOMY WITH RADIOACTIVE SEED AND SENTINEL LYMPH NODE BIOPSY Right 01/21/2015   Procedure: RIGHT BREAST LUMPECTOMY WITH RADIOACTIVE SEED AND RIGHT SENTINEL LYMPH NODE BIOPSY;  Surgeon: Stark Klein, MD;  Location: Uncertain;  Service: General;  Laterality: Right;  . CESAREAN SECTION    . PORTACATH PLACEMENT  Left 01/21/2015   Procedure: INSERTION PORT-A-CATH;  Surgeon: Stark Klein, MD;  Location: [place];  Service: General;  Laterality: Left;  . TUBAL LIGATION      Allergies  Allergen Reactions  . Eggs Or Egg-Derived Products Other (See Comments)    Severe pain In stomach area stomach swells up  . Penicillins Hives    Has patient had a PCN reaction causing  immediate rash, facial/tongue/throat swelling, SOB or lightheadedness with hypotension: No Has patient had a PCN reaction causing severe rash involving mucus membranes or skin necrosis: No Has patient had a PCN reaction that required hospitalization No Has patient had a PCN reaction occurring within the last 10 years: No If all of the above answers are "NO", then may proceed with Cephalosporin use.   Marland Kitchen Penicillin G Rash    No current facility-administered medications on file prior to encounter.    Current Outpatient Prescriptions on File Prior to Encounter  Medication Sig Dispense Refill  . calcium carbonate (OS-CAL - DOSED IN MG OF ELEMENTAL CALCIUM) 1250 (500 CA) MG tablet Take 1 tablet by mouth daily.     . cholecalciferol (VITAMIN D) 1000 UNITS tablet Take 6,000 Units by mouth daily.    Marland Kitchen levothyroxine (SYNTHROID, LEVOTHROID) 112 MCG tablet Take 112 mcg by mouth daily.    . magnesium oxide (MAG-OX) 400 MG tablet Take 400 mg by mouth daily.    . SUMAtriptan (IMITREX) 50 MG tablet Take 50 mg by mouth every 2 (two) hours as needed for migraine. May repeat in 2 hours if headache persists or recurs.    . cyclobenzaprine (FLEXERIL) 10 MG tablet Take 10 mg by mouth 2 (two) times daily as needed for muscle spasms (fibrolmyalgia).    Marland Kitchen oxyCODONE (ROXICODONE) 5 MG immediate release tablet Take 1 tablet (5 mg total) by mouth every 6 (six) hours as needed for severe pain. 10 tablet 0  . pantoprazole (PROTONIX) 40 MG tablet Take 1 tablet (40 mg total) by mouth 2 (two) times daily before a meal. 30 tablet 0  . promethazine (PHENERGAN) 12.5 MG tablet Take 1 tablet (12.5 mg total) by mouth every 6 (six) hours as needed for nausea or vomiting. 30 tablet 0     Social History   Social History  . Marital status: Married    Spouse name: N/A  . Number of children: N/A  . Years of education: N/A   Occupational History  . Not on file.   Social History Main Topics  . Smoking status: Former Smoker     Packs/day: 2.00    Types: Cigarettes    Start date: 40    Quit date: 1996  . Smokeless tobacco: Never Used  . Alcohol use Yes     Comment: social  . Drug use: No  . Sexual activity: Yes    Birth control/ protection: None   Other Topics Concern  . Not on file   Social History Narrative  . No narrative on file    Family History  Problem Relation Age of Onset  . Alzheimer's disease Father   . Cardiomyopathy Father   . Diabetes Father   . Diabetes Other   . Hypertension Other   . Coronary artery disease Mother   . Arthritis Mother      Review of Systems:   GENERAL: No Fever or diaphoresis. Positive for chills and fatigue/malaise.  HEENT: No nasal discharge or bleeding. No throat pain or swelling. No eye pain or eye redness.  RESPIRATORY: No cough, wheezing. Positive for shortness of breath.  CARDIOVASCULAR: Mild left sided chest pain in area of her port, sharp, intermittent. No palpitations.  GI: See HPI. Abdominal pain. Blood in stool x 1. Nausea. Diarrhea resolved. Vomiting resolved. No constipation.  NEUROLOGICAL: Frequent migraines. No focal weakness.  INTEGUMENT: Dry skin and mild itching on arms. Otherwise no rashes, itching, or new lesions.  LYMPHATIC SYSTEM: no lymph node swelling or pain.  MUSCULOSKELETAL: no new joint pain or joint swelling. Chronic back pain. GENITOURINARY: No dysuria or hematuria.  ENDOCRINE: No polyuria or polydipsia.  HEME: No chronic anemia or easy bruising.   Physical Exam:  Vitals:   02/08/16 1600 02/08/16 1630 02/08/16 1700 02/08/16 1705  BP: 122/75 131/66 125/67 125/67  Pulse: 65 68 63 66  Resp:    16  Temp:      TempSrc:      SpO2: 95% 93% 96% 98%  Height:        GENERAL: Ill-appearing, well nourished, well-developed.  HEENT: Normocephalic, atraumatic; pupils equal, round, and reactive to light. Nares patent, without discharge or bleeding. No oropharyngeal lesions or erythema. Mucous membranes are dry. Ears: Pinna without  erythema or lesions bilaterally. Auditory canals without erythema. Right auditory canal impacted with cerumen, and TM is not visible. Left auditory canal with cerumen. TM partially visualized on left; no erythema. NECK: is supple, no masses, trachea midline.  RESPIRATORY: Clear to auscultation bilaterally. Chest wall movements are symmetric. No use of accessory muscles to breathe.  No wheezing, rales, rhonchi. CARDIOVASCULAR: Normal S1, S2. No rubs, or gallops. PMI non-displaced. Carotids: no carotid bruits. No bradycardia or tachycardia. DP pulses 2+ bilaterally.  GI: soft, non-distended, normal active bowel sounds. No hepatosplenomegaly.  Tenderness in RUQ and epigastric areas. No rebound or guarding. INTEGUMENT: Clean, dry, and intact. No rashes. Somewhat dry skin on arms bilaterally. MUSCULOSKELETAL: Moving all extremities. No cyanosis. No clubbing. Edema: none bilaterally.  NEUROLOGICAL: Cranial nerves 2-12 grossly intact. Reflexes: 2+ bilaterally. Babinski: toes downgoing bilaterally.  Motor 5/5 throughout. Sensory grossly intact to light touch. Intact rapid alternating movements bilaterally. No pronator drift.  PSYCHIATRIC: Fully oriented. Normal and appropriate affect.  LYMPHATIC: No cervical lymphadenopathy. No supraclavicular lymphadenopathy.   Labs on Admission: I have personally reviewed following labs and imaging studies.  Results for orders placed or performed during the hospital encounter of 02/08/16 (from the past 24 hour(s))  CBC with Differential/Platelet   Collection Time: 02/08/16  3:52 PM  Result Value Ref Range   WBC 4.8 4.0 - 10.5 K/uL   RBC 4.57 3.87 - 5.11 MIL/uL   Hemoglobin 13.4 12.0 - 15.0 g/dL   HCT 39.6 36.0 - 46.0 %   MCV 86.7 78.0 - 100.0 fL   MCH 29.3 26.0 - 34.0 pg   MCHC 33.8 30.0 - 36.0 g/dL   RDW 13.3 11.5 - 15.5 %   Platelets 179 150 - 400 K/uL   Neutrophils Relative % 61 %   Neutro Abs 2.9 1.7 - 7.7 K/uL   Lymphocytes Relative 22 %   Lymphs Abs  1.0 0.7 - 4.0 K/uL   Monocytes Relative 7 %   Monocytes Absolute 0.3 0.1 - 1.0 K/uL   Eosinophils Relative 10 %   Eosinophils Absolute 0.5 0.0 - 0.7 K/uL   Basophils Relative 0 %   Basophils Absolute 0.0 0.0 - 0.1 K/uL  Comprehensive metabolic panel   Collection Time: 02/08/16  3:52 PM  Result Value Ref Range   Sodium 138 135 - 145  mmol/L   Potassium 3.5 3.5 - 5.1 mmol/L   Chloride 105 101 - 111 mmol/L   CO2 22 22 - 32 mmol/L   Glucose, Bld 111 (H) 65 - 99 mg/dL   BUN 9 6 - 20 mg/dL   Creatinine, Ser 0.84 0.44 - 1.00 mg/dL   Calcium 8.9 8.9 - 10.3 mg/dL   Total Protein 6.8 6.5 - 8.1 g/dL   Albumin 3.9 3.5 - 5.0 g/dL   AST 330 (H) 15 - 41 U/L   ALT 515 (H) 14 - 54 U/L   Alkaline Phosphatase 116 38 - 126 U/L   Total Bilirubin 3.1 (H) 0.3 - 1.2 mg/dL   GFR calc non Af Amer >60 >60 mL/min   GFR calc Af Amer >60 >60 mL/min   Anion gap 11 5 - 15  Lipase, blood   Collection Time: 02/08/16  3:52 PM  Result Value Ref Range   Lipase 26 11 - 51 U/L   Note: Previous values 02/07/16:  AP 121, 118 Albumin 3.5, 3.6 AST 888, 554 ALT 807, 691 Total bili 3.4, 3.3 Direct bili 1.3, indirect bili 2 Hepatitis A Ab, IgM, Negative Hepatitis B Surface Ag, Negative Hepatitis B Core Ab, Ig M, Negative Hepatitis C Ab < 0.1    Radiological Exams on Admission:  No imaging done on this admission 02/08/16.  Previous imaging:  CT abdomen/pelvis 02/06/16: COMPARISON:  02/24/2015  FINDINGS: Lower chest: Minimal subpleural scarring at anterolateral RIGHT mid lung likely related to prior chest wall radiation therapy. Lung bases otherwise clear.  Hepatobiliary: Fatty infiltration of liver with mild focal sparing adjacent to gallbladder fossa. Dependent calcified gallstones within gallbladder. Subcapsular low-attenuation nodule lateral RIGHT lobe liver 11 x 11 x 8 mm, likely corresponding to the ultrasound finding, unchanged from 02/24/2015 exam. This lesion measured 9 x 9 x 11 mm on  09/02/2009, overall demonstrating little changed over 6+ years.  Pancreas: Normal appearance  Spleen: Normal appearance. Probable tiny splenules in LEFT upper quadrant.  Adrenals/Urinary Tract: Adrenal glands normal appearance. Small RIGHT renal cysts. Kidneys, kidneys, ureters, and bladder otherwise normal appearance.  Stomach/Bowel: Minimal colonic diverticulosis. Normal appendix. Stomach and small bowel loops normal appearance.  Vascular/Lymphatic: Aorta normal caliber.  No adenopathy.  Reproductive: Unremarkable uterus and ovaries  Other: No free air or free fluid.  No hernia.  Musculoskeletal: Bones demineralized.  IMPRESSION: Fatty infiltration of liver with a small low-attenuation nodule at  the lateral aspect of the RIGHT lobe of the liver, 11 x 11 x 8 mm, little changed from the 11 x 9 x 9 mm size in 2011, likely benign.  Minimal colonic diverticulosis.  No acute intra-abdominal or intrapelvic abnormalities.   US Abdomen 02/06/16: EXAM: ABDOMEN ULTRASOUND COMPLETE  COMPARISON:  None.  FINDINGS: Gallbladder: The gallbladder is normally distended. Layering sludge is seen. 1 cm mobile calculus is also seen. The gallbladder wall is slightly thickened to 3.3 mm. Sonographic Murphy's sign was reported as negative.  Common bile duct: Diameter: 3.3 mm.  Liver: Diffusely increased echogenicity of the liver. In the dome of the right lobe of the liver there is a subcapsular 0.9 x 1.0 x 0.7 cm hypoechoic mass.  IVC: No abnormality visualized.  Pancreas: Visualized portion unremarkable.  Spleen: Size and appearance within normal limits.  Right Kidney: Length: 11.1. Echogenicity within normal limits. No mass or hydronephrosis visualized.  Left Kidney: Length: 10.9. Echogenicity within normal limits. No mass or hydronephrosis visualized.  Abdominal aorta: No aneurysm visualized.  Other findings: None.  IMPRESSION: Gallbladder sludge  with borderline gallbladder wall thickening, which may be seen in early acute cholecystitis. The sonographic Murphy's sign however was negative, which argues against acute cholecystitis, unless the patient was medicated.  Diffusely increased echogenicity of the liver which may be seen with hepatic steatosis.  Indeterminate 1 cm hypoechoic subcapsular right lobe of the liver lesion. Further evaluation with MRI or CT, liver protocol, may be considered, as metastatic lesion cannot be excluded.    Assessment/Plan  Diagnoses, in order, include: 1. Acute cholycistitis 2. Acute hepatitis 3. RUQ abdominal pain 4. Lupus 5. Elevated LFTs 6. Hypothyroidism 7. Right breast cancer 8. Nausea   Cholecystitis, acute Suspected. No [place] service available on admission, 02/08/16. Reviewed CT and [place] from 02/06/16. Failed outpatient management of her abdominal pain. Plan: Emergency department consulted Dr. Arnoldo Morale, who recommends: Start IV cipro. NPO after midnight. May consider surgical intervention or ERCP tomorrow.   RUQ abdominal pain Severe. Failed outpatient management. Plan: Requiring IV dilaudid, will continue PRN IV dilaudid. Surgical evaluation planned for the am.  Nausea Currently without vomiting. Plan: PRN Phenergan, Compazine.  Hepatitis Failed outpatient management.  LFTs slowly decreasing but still quite elevated. Plan: Surgery consult. Follow CMP. Check direct bili. Consider GI consult if no improvement. Labs for viral hepatitis completed 02/06/16 and were negative. Has history of lupus. Will test for autoimmune hepatitis: ordered ANA, anti-smooth muscle Ab, and anti-mitochondrial Ab for the AM.  Elevated LFTs Severe elevation of AST and ALT. Bili also elevated. Slight improvement from 02/07/16 but still elevated. Plan: Follow CMP. Check INR. General surgery consult.  Lupus (systemic lupus erythematosus) ([facility]) Rare flare ups. May increase risk for other  autoimmune diagnoses. Plan: Monitor.  Hypothyroidism Grave's disease. Plan: Continue synthroid.  Cancer of right breast, stage 2 ([place]) Diagnosed 12/2014. Followed by Dr. Whitney Muse. Had chemotherapy and radiation. Lumpectomy. Plan: Continue follow up with Heme/Onc.  ___________________________________________   DVT prophylaxis: SCDs now. Lovenox to start tomorrow after possible procedure.  Code Status: Full Family Communication: Discussed in detail with patient and husband, who was at bedside.  Disposition Plan:  Discharge to home in 2-5 days Consults called: Dr. Arnoldo Morale, General Surgeon, consulted by emergency department Admission status: Inpatient. Admitted to medical floor.       Patient requires admission due to risks, which include:      Liver failure, Shock, Death, worsening pain and suffering.    Tacey Ruiz MD [facility] Pager [phone]

## 2016-02-08 NOTE — Assessment & Plan Note (Signed)
Grave's disease. Plan: Continue synthroid.

## 2016-02-08 NOTE — Assessment & Plan Note (Addendum)
Currently without vomiting. Plan: PRN Phenergan, Compazine.

## 2016-02-09 ENCOUNTER — Inpatient Hospital Stay (HOSPITAL_COMMUNITY): Payer: Medicaid Other

## 2016-02-09 LAB — COMPREHENSIVE METABOLIC PANEL
ALT: 472 U/L — AB (ref 14–54)
AST: 304 U/L — AB (ref 15–41)
Albumin: 3.6 g/dL (ref 3.5–5.0)
Alkaline Phosphatase: 137 U/L — ABNORMAL HIGH (ref 38–126)
Anion gap: 8 (ref 5–15)
BILIRUBIN TOTAL: 2.7 mg/dL — AB (ref 0.3–1.2)
BUN: 8 mg/dL (ref 6–20)
CO2: 26 mmol/L (ref 22–32)
CREATININE: 0.93 mg/dL (ref 0.44–1.00)
Calcium: 8.7 mg/dL — ABNORMAL LOW (ref 8.9–10.3)
Chloride: 105 mmol/L (ref 101–111)
GFR calc Af Amer: >60 mL/min (ref >60)
Glucose, Bld: 102 mg/dL — ABNORMAL HIGH (ref 65–99)
Potassium: 3.5 mmol/L (ref 3.5–5.1)
Sodium: 139 mmol/L (ref 135–145)
TOTAL PROTEIN: 6.5 g/dL (ref 6.5–8.1)

## 2016-02-09 LAB — PROTIME-INR
INR: 0.92
Prothrombin Time: 12.4 seconds (ref 11.4–15.2)

## 2016-02-09 LAB — CBC
HCT: 41.1 % (ref 36.0–46.0)
Hemoglobin: 13.7 g/dL (ref 12.0–15.0)
MCH: 29.5 pg (ref 26.0–34.0)
MCHC: 33.3 g/dL (ref 30.0–36.0)
MCV: 88.6 fL (ref 78.0–100.0)
PLATELETS: 178 10*3/uL (ref 150–400)
RBC: 4.64 MIL/uL (ref 3.87–5.11)
RDW: 13.5 % (ref 11.5–15.5)
WBC: 4.1 10*3/uL (ref 4.0–10.5)

## 2016-02-09 LAB — URINALYSIS, ROUTINE W REFLEX MICROSCOPIC
Bilirubin Urine: NEGATIVE
GLUCOSE, UA: NEGATIVE mg/dL
HGB URINE DIPSTICK: NEGATIVE
KETONES UR: NEGATIVE mg/dL
LEUKOCYTES UA: NEGATIVE
Nitrite: NEGATIVE
PH: 6 (ref 5.0–8.0)
Protein, ur: NEGATIVE mg/dL
Specific Gravity, Urine: 1.011 (ref 1.005–1.030)

## 2016-02-09 LAB — LIPASE, BLOOD: Lipase: 20 U/L (ref 11–51)

## 2016-02-09 LAB — BILIRUBIN, DIRECT: BILIRUBIN DIRECT: 1.2 mg/dL — AB (ref 0.1–0.5)

## 2016-02-09 LAB — SURGICAL PCR SCREEN
MRSA, PCR: NEGATIVE
STAPHYLOCOCCUS AUREUS: POSITIVE — AB

## 2016-02-09 MED ORDER — LORAZEPAM 2 MG/ML IJ SOLN
1.0000 mg | Freq: Once | INTRAMUSCULAR | Status: AC
  Administered 2016-02-09: 1 mg via INTRAVENOUS
  Filled 2016-02-09: qty 1

## 2016-02-09 MED ORDER — CHLORHEXIDINE GLUCONATE CLOTH 2 % EX PADS
6.0000 | MEDICATED_PAD | Freq: Once | CUTANEOUS | Status: AC
  Administered 2016-02-10: 6 via TOPICAL

## 2016-02-09 MED ORDER — GADOBENATE DIMEGLUMINE 529 MG/ML IV SOLN
18.0000 mL | Freq: Once | INTRAVENOUS | Status: AC | PRN
  Administered 2016-02-09: 18 mL via INTRAVENOUS

## 2016-02-09 NOTE — Care Management Note (Signed)
Case Management Note  Patient Details  Name: Tara Mcfarland MRN: [id number] Date of Birth: [birth year]  Subjective/Objective:  CM reviewed chart for needs. Patient adm with cholecystitis/elevated labs, here for same 02/06/2016, requested discharge 02/07/2016.  She has PCP, transportation and Medicaid.         Action/Plan: Anticipate DC home with self care. Will follow for CM needs, none identified currently.    Expected Discharge Date:  02/11/16               Expected Discharge Plan:  Home/Self Care  In-House Referral:  NA  Discharge planning Services  CM Consult  Post Acute Care Choice:  NA Choice offered to:  NA  DME Arranged:    DME Agency:     HH Arranged:    HH Agency:     Status of Service:  Completed, signed off  If discussed at [employer] of Stay Meetings, dates discussed:    Additional Comments:  Ocean Schildt, Chauncey Reading, RN 02/09/2016, 11:55 AM

## 2016-02-09 NOTE — Progress Notes (Signed)
SURGICAL PROGRESS NOTE (cpt [phone])  Hospital Day(s): 1.   Post op day(s):  Tara Mcfarland   Interval History: Patient seen and examined, no acute events or new complaints overnight. Patient reports tolerating clear liquids diet without abdominal pain, nausea, fever/chills, "heartburn", CP, or SOB.  Review of Systems:  Constitutional: denies fever, chills  HEENT: denies cough or congestion  Respiratory: denies any shortness of breath  Cardiovascular: denies chest pain or palpitations  Gastrointestinal: denies abdominal pain, N/V, or diarrhea Genitourinary: denies burning with urination or urinary frequency Musculoskeletal: denies pain, decreased motor or sensation Integumentary: denies any other rashes or skin discolorations Neurological: denies HA or vision/hearing changes   Vital signs in last 24 hours: [min-max] current  Temp:  [97.8 F (36.6 C)-98.8 F (37.1 C)] 98 F (36.7 C) (12/26 0530) Pulse Rate:  [63-74] 74 (12/26 0530) Resp:  [16-18] 18 (12/26 0530) BP: (122-131)/(52-75) 124/63 (12/26 0530) SpO2:  [93 %-100 %] 100 % (12/26 0530) Weight:  [90.1 kg (198 lb 11.2 oz)] 90.1 kg (198 lb 11.2 oz) (12/25 1832)     Height: 5\' 4"  (162.6 cm) Weight: 90.1 kg (198 lb 11.2 oz) BMI (Calculated): 34.2   Intake/Output this shift:  No intake/output data recorded.   Intake/Output last 2 shifts:  @IOLAST2SHIFTS @   Physical Exam:  Constitutional: alert, cooperative and no distress  HENT: normocephalic without obvious abnormality  Eyes: PERRL, EOM's grossly intact and symmetric  Neuro: CN II - XII grossly intact and symmetric without deficit  Respiratory: breathing non-labored at rest  Cardiovascular: regular rate and sinus rhythm  Gastrointestinal: soft, very minimal epigastric tenderness to deep palpation, and non-distended  Musculoskeletal: UE and LE FROM, motor and sensation grossly intact, NT   Labs:  CBC Latest Ref Rng & Units 02/09/2016 02/08/2016 02/06/2016  WBC 4.0 - 10.5 K/uL 4.1  4.8 7.2  Hemoglobin 12.0 - 15.0 g/dL 13.7 13.4 14.2  Hematocrit 36.0 - 46.0 % 41.1 39.6 41.8  Platelets 150 - 400 K/uL 178 179 231   CMP Latest Ref Rng & Units 02/09/2016 02/08/2016 02/07/2016  Glucose 65 - 99 mg/dL 102(H) 111(H) -  BUN 6 - 20 mg/dL 8 9 -  Creatinine 0.44 - 1.00 mg/dL 0.93 0.84 -  Sodium 135 - 145 mmol/L 139 138 -  Potassium 3.5 - 5.1 mmol/L 3.5 3.5 -  Chloride 101 - 111 mmol/L 105 105 -  CO2 22 - 32 mmol/L 26 22 -  Calcium 8.9 - 10.3 mg/dL 8.7(L) 8.9 -  Total Protein 6.5 - 8.1 g/dL 6.5 6.8 6.6  Total Bilirubin 0.3 - 1.2 mg/dL 2.7(H) 3.1(H) 3.3(H)  Alkaline Phos 38 - 126 U/L 137(H) 116 118  AST 15 - 41 U/L 304(H) 330(H) 554(H)  ALT 14 - 54 U/L 472(H) 515(H) 691(H)    Imaging studies: No new pertinent imaging studies   Assessment/Plan: (ICD-10's: K80.51, K21.9) 55 y.o. female with improving likely microcholedocholithiasis attributable to biliary "sludge" (microcholelithiasis) and resolved epigastric abdominal pain, complicated by GERD and by pertinent comorbidities including obesity (BMI 34), GERD, Right breast infiltrating ductal carcinoma, generalized anxiety disorder, Graves disease, and seizure disorder.              - PPI, antibiotics, clear liquids diet             - check daily morning LFT's until 1 day after gallbladder removed             - agree with MRCP, though ERCP less likely as long as LFT's continue decreasing  -  NPO after midnight for cholecystectomy +/- cholangiogram likely tomorrow (12/27) or Thursday (12/28)  - all risks, benefits, and alternatives to above procedure(s) were discussed with the patient, who elect(s) to proceed, all of her questions were answered to her expressed satisfaction, and informed consent was accordingly obtained             - consider GI consultation if LFT's increase or do not continue decreasing             - medical management of comorbidities  - ambulation encouraged             - DVT prophylaxis  All of the above  findings and recommendations were discussed with the patient, and all of patient's questions were answered to her expressed satisfaction.  Thank you for the opportunity to participate in this patient's care.  -- Marilynne Drivers Rosana Hoes, MD, [place]: [facility] Office: [phone]

## 2016-02-09 NOTE — Progress Notes (Signed)
PROGRESS NOTE    Tara Mcfarland  [id number] DOB: August 21, 1960 DOA: 02/08/2016 PCP: Vesta Mixer    Brief Narrative:  54 y/o female who had initially presented to the hospital on 12/23 with epigastric pain and nausea/ vomiting. She was found to have elevated LFTs and abd ultrasound showed gallbladder sludge and cholelithiasis. She was seen by general surgery. With overnight observation, she reported feeling better, pain resolved, no further vomiting and was tolerating solid food. She wished to discharge home for christmas and pursue surgery on an outpatient. LFTs were repeated and found to be stable/trending down, so she was discharged with close follow up with gen surgery.  She returned to ED on 12/25 with worsening symptoms. LFTs trending down. Gen surgery following and plans on cholecystectomy. MRCP planned for today.   Assessment & Plan:   Principal Problem:   Cholecystitis, acute Active Problems:   Cancer of right breast, stage 2 (HCC)   Lupus (systemic lupus erythematosus) (HCC)   Nausea   Hepatitis   Hypothyroidism   Elevated LFTs   RUQ abdominal pain   Acute cholecystitis. Patient presents with epigastric pain, nausea and elevated LFTs. Last abd ultrasound showed gallbladder sludge and cholelithiasis. General surgery following. Plans for cholecystectomy tomorrow. MRCP planned for today. She is on ciprofloxacin.  2. Hypothyroidism. continue on synthroid  3. GERD. Continue PPI  4. Lupus. No evidence of flare. She is not on any maintenance meds.   DVT prophylaxis: lovenox Code Status: full Family Communication: no family present Disposition Plan: discharge home once improved   Consultants:   General surgery  Procedures:     Antimicrobials:   cipro 12/25>>   Subjective: Nausea and epigastric pain is better  Objective: Vitals:   02/08/16 1832 02/08/16 2025 02/08/16 2200 02/09/16 0530  BP: 123/66  (!) 126/52 124/63  Pulse: 66  74 74    Resp: 18  18 18   Temp: 98.8 F (37.1 C)  97.8 F (36.6 C) 98 F (36.7 C)  TempSrc: Oral  Oral Oral  SpO2: 99% 97% 100% 100%  Weight: 90.1 kg (198 lb 11.2 oz)     Height: 5\' 4"  (1.626 m)       Intake/Output Summary (Last 24 hours) at 02/09/16 1155 Last data filed at 02/09/16 1114  Gross per 24 hour  Intake          4169.17 ml  Output             1800 ml  Net          2369.17 ml   Filed Weights   02/08/16 1832  Weight: 90.1 kg (198 lb 11.2 oz)    Examination:  General exam: Appears calm and comfortable  Respiratory system: Clear to auscultation. Respiratory effort normal. Cardiovascular system: S1 & S2 heard, RRR. No JVD, murmurs, rubs, gallops or clicks. No pedal edema. Gastrointestinal system: Abdomen is nondistended, soft and tender in epigastrium. No organomegaly or masses felt. Normal bowel sounds heard. Central nervous system: Alert and oriented. No focal neurological deficits. Extremities: Symmetric 5 x 5 power. Skin: No rashes, lesions or ulcers Psychiatry: Judgement and insight appear normal. Mood & affect appropriate.     Data Reviewed: I have personally reviewed following labs and imaging studies  CBC:  Recent Labs Lab 02/06/16 0859 02/08/16 1552 02/09/16 0540  WBC 7.2 4.8 4.1  NEUTROABS 5.8 2.9  --   HGB 14.2 13.4 13.7  HCT 41.8 39.6 41.1  MCV 85.5 86.7 88.6  PLT 231 179  0000000   Basic Metabolic Panel:  Recent Labs Lab 02/06/16 0859 02/07/16 0702 02/08/16 1552 02/09/16 0540  NA 139 138 138 139  K 3.7 3.9 3.5 3.5  CL 106 107 105 105  CO2 23 26 22 26   GLUCOSE 123* 93 111* 102*  BUN 14 13 9 8   CREATININE 0.84 0.81 0.84 0.93  CALCIUM 9.6 8.2* 8.9 8.7*   GFR: Estimated Creatinine Clearance: 74.3 mL/min (by C-G formula based on SCr of 0.93 mg/dL). Liver Function Tests:  Recent Labs Lab 02/06/16 0859 02/07/16 0702 02/07/16 1626 02/08/16 1552 02/09/16 0540  AST 884* 888* 554* 330* 304*  ALT 370* 807* 691* 515* 472*  ALKPHOS 121 121  118 116 137*  BILITOT 1.9* 3.4* 3.3* 3.1* 2.7*  PROT 7.7 6.3* 6.6 6.8 6.5  ALBUMIN 4.3 3.5 3.6 3.9 3.6    Recent Labs Lab 02/06/16 0859 02/08/16 1552 02/09/16 0540  LIPASE 29 26 20    No results for input(s): AMMONIA in the last 168 hours. Coagulation Profile:  Recent Labs Lab 02/09/16 0540  INR 0.92   Cardiac Enzymes:  Recent Labs Lab 02/06/16 0905  TROPONINI <0.03   BNP (last 3 results) No results for input(s): PROBNP in the last 8760 hours. HbA1C: No results for input(s): HGBA1C in the last 72 hours. CBG: No results for input(s): GLUCAP in the last 168 hours. Lipid Profile: No results for input(s): CHOL, HDL, LDLCALC, TRIG, CHOLHDL, LDLDIRECT in the last 72 hours. Thyroid Function Tests: No results for input(s): TSH, T4TOTAL, FREET4, T3FREE, THYROIDAB in the last 72 hours. Anemia Panel: No results for input(s): VITAMINB12, FOLATE, FERRITIN, TIBC, IRON, RETICCTPCT in the last 72 hours. Sepsis Labs: No results for input(s): PROCALCITON, LATICACIDVEN in the last 168 hours.  Recent Results (from the past 240 hour(s))  Culture, blood (routine x 2)     Status: None (Preliminary result)   Collection Time: 02/08/16  7:46 PM  Result Value Ref Range Status   Specimen Description BLOOD LEFT ARM  Final   Special Requests BOTTLES DRAWN AEROBIC AND ANAEROBIC 8CC EACH  Final   Culture NO GROWTH < 12 HOURS  Final   Report Status PENDING  Incomplete  Culture, blood (routine x 2)     Status: None (Preliminary result)   Collection Time: 02/08/16  7:49 PM  Result Value Ref Range Status   Specimen Description BLOOD LEFT ARM  Final   Special Requests BOTTLES DRAWN AEROBIC AND ANAEROBIC 6CC EACH  Final   Culture NO GROWTH < 12 HOURS  Final   Report Status PENDING  Incomplete         Radiology Studies: No results found.      Scheduled Meds: . ciprofloxacin  400 mg Intravenous Q12H  . enoxaparin (LOVENOX) injection  40 mg Subcutaneous Q24H  . levothyroxine  112 mcg  Oral QAC breakfast  . magnesium oxide  400 mg Oral Daily  . pantoprazole  40 mg Oral BID AC   Continuous Infusions: . sodium chloride 1,000 mL (02/08/16 1555)  . sodium chloride 75 mL/hr at 02/09/16 0654     LOS: 1 day    Time spent: 69mins    MEMON,JEHANZEB, MD [facility] Pager [phone]  If 7PM-7AM, please contact night-coverage [email or web address] Password [facility] 02/09/2016, 11:55 AM

## 2016-02-09 NOTE — Progress Notes (Signed)
Pt transported down to MRI via [facility].

## 2016-02-10 ENCOUNTER — Encounter (HOSPITAL_COMMUNITY): Payer: Self-pay | Admitting: Anesthesiology

## 2016-02-10 ENCOUNTER — Encounter (HOSPITAL_COMMUNITY)
Admission: EM | Disposition: A | Discharge status: Home / Self Care | Payer: Self-pay | Source: Home / Self Care | Attending: Internal Medicine

## 2016-02-10 DIAGNOSIS — K810 Acute cholecystitis: Secondary | ICD-10-CM

## 2016-02-10 DIAGNOSIS — R7989 Other specified abnormal findings of blood chemistry: Secondary | ICD-10-CM

## 2016-02-10 DIAGNOSIS — E876 Hypokalemia: Secondary | ICD-10-CM

## 2016-02-10 DIAGNOSIS — E038 Other specified hypothyroidism: Secondary | ICD-10-CM

## 2016-02-10 LAB — URINE CULTURE
Culture: NO GROWTH
Special Requests: NORMAL

## 2016-02-10 LAB — COMPREHENSIVE METABOLIC PANEL
ALK PHOS: 127 U/L — AB (ref 38–126)
ALT: 344 U/L — ABNORMAL HIGH (ref 14–54)
ANION GAP: 6 (ref 5–15)
AST: 176 U/L — ABNORMAL HIGH (ref 15–41)
Albumin: 3.4 g/dL — ABNORMAL LOW (ref 3.5–5.0)
BUN: 9 mg/dL (ref 6–20)
CALCIUM: 8.6 mg/dL — AB (ref 8.9–10.3)
CO2: 27 mmol/L (ref 22–32)
Chloride: 106 mmol/L (ref 101–111)
Creatinine, Ser: 0.89 mg/dL (ref 0.44–1.00)
GFR calc non Af Amer: >60 mL/min (ref >60)
Glucose, Bld: 99 mg/dL (ref 65–99)
Potassium: 3.4 mmol/L — ABNORMAL LOW (ref 3.5–5.1)
SODIUM: 139 mmol/L (ref 135–145)
TOTAL PROTEIN: 6.2 g/dL — AB (ref 6.5–8.1)
Total Bilirubin: 1.3 mg/dL — ABNORMAL HIGH (ref 0.3–1.2)

## 2016-02-10 LAB — ANTI-SMOOTH MUSCLE ANTIBODY, IGG: F-ACTIN AB IGG: 10 U (ref 0–19)

## 2016-02-10 LAB — MITOCHONDRIAL ANTIBODIES: MITOCHONDRIAL M2 AB, IGG: 4.7 U (ref 0.0–20.0)

## 2016-02-10 LAB — FANA STAINING PATTERNS: Homogeneous Pattern: 1:320 {titer} — ABNORMAL HIGH

## 2016-02-10 LAB — ANTINUCLEAR ANTIBODIES, IFA: ANA Ab, IFA: POSITIVE — AB

## 2016-02-10 LAB — IGG: IGG (IMMUNOGLOBIN G), SERUM: 621 mg/dL — AB (ref 700–1600)

## 2016-02-10 SURGERY — LAPAROSCOPIC CHOLECYSTECTOMY
Anesthesia: General

## 2016-02-10 MED ORDER — SALINE SPRAY 0.65 % NA SOLN
1.0000 | NASAL | Status: DC | PRN
  Filled 2016-02-10: qty 44

## 2016-02-10 MED ORDER — POTASSIUM CHLORIDE 10 MEQ/100ML IV SOLN
10.0000 meq | INTRAVENOUS | Status: AC
  Administered 2016-02-10 (×3): 10 meq via INTRAVENOUS
  Filled 2016-02-10: qty 100

## 2016-02-10 NOTE — Progress Notes (Signed)
SURGICAL PROGRESS NOTE (cpt [phone])  Hospital Day(s): 2.   Post op day(s): Day of Surgery.   Interval History: Patient seen and examined, no acute events or new complaints overnight. Patient reports minimal RUQ/epigastric abdominal pain with tolerating clear liquids diet, +flatus, denies fever/chills, N/V, GERD, CP, or SOB.  Review of Systems:  Constitutional: denies fever, chills  HEENT: denies cough or congestion  Respiratory: denies any shortness of breath  Cardiovascular: denies chest pain or palpitations  Gastrointestinal: abdominal pain, N/V, and bowel function as per interval history Genitourinary: denies burning with urination or urinary frequency Musculoskeletal: denies pain, decreased motor or sensation Integumentary: denies any other rashes or skin discolorations Neurological: denies HA or vision/hearing changes   Vital signs in last 24 hours: [min-max] current  Temp:  [97.4 F (36.3 C)-98.8 F (37.1 C)] 97.4 F (36.3 C) (12/27 0621) Pulse Rate:  [65-69] 65 (12/27 0621) Resp:  [20] 20 (12/27 0621) BP: (108-125)/(50-67) 116/50 (12/27 0621) SpO2:  [95 %-96 %] 96 % (12/27 0621)     Height: 5\' 4"  (162.6 cm) Weight: 90.1 kg (198 lb 11.2 oz) BMI (Calculated): 34.2   Intake/Output this shift:  No intake/output data recorded.   Intake/Output last 2 shifts:  @IOLAST2SHIFTS @   Physical Exam:  Constitutional: alert, cooperative and no distress  HENT: normocephalic without obvious abnormality  Eyes: PERRL, EOM's grossly intact and symmetric  Neuro: CN II - XII grossly intact and symmetric without deficit  Respiratory: breathing non-labored at rest  Cardiovascular: regular rate and sinus rhythm  Gastrointestinal: soft, minimal RUQ/epigastric tenderness to palpation, and non-distended  Musculoskeletal: UE and LE FROM, no edema or wounds, motor and sensation grossly intact, NT    Labs:  CBC Latest Ref Rng & Units 02/09/2016 02/08/2016 02/06/2016  WBC 4.0 - 10.5 K/uL 4.1  4.8 7.2  Hemoglobin 12.0 - 15.0 g/dL 13.7 13.4 14.2  Hematocrit 36.0 - 46.0 % 41.1 39.6 41.8  Platelets 150 - 400 K/uL 178 179 231   CMP Latest Ref Rng & Units 02/10/2016 02/09/2016 02/08/2016  Glucose 65 - 99 mg/dL 99 102(H) 111(H)  BUN 6 - 20 mg/dL 9 8 9   Creatinine 0.44 - 1.00 mg/dL 0.89 0.93 0.84  Sodium 135 - 145 mmol/L 139 139 138  Potassium 3.5 - 5.1 mmol/L 3.4(L) 3.5 3.5  Chloride 101 - 111 mmol/L 106 105 105  CO2 22 - 32 mmol/L 27 26 22   Calcium 8.9 - 10.3 mg/dL 8.6(L) 8.7(L) 8.9  Total Protein 6.5 - 8.1 g/dL 6.2(L) 6.5 6.8  Total Bilirubin 0.3 - 1.2 mg/dL 1.3(H) 2.7(H) 3.1(H)  Alkaline Phos 38 - 126 U/L 127(H) 137(H) 116  AST 15 - 41 U/L 176(H) 304(H) 330(H)  ALT 14 - 54 U/L 344(H) 472(H) 515(H)    Imaging studies:  MRCP (02/09/2016) Numerous gallstones along with diffuse mild gallbladder wall thickening Bayard Males though some of this wall thickening may be due to contraction. No stones in the intrahepatic or extrahepatic biliary tree identified. No biliary dilatation.   Assessment/Plan: (ICD-10's: K80.51, K21.9) 55 y.o.femalewith improving likely microcholedocholithiasis attributable to biliary "sludge" (microcholelithiasis) and resolved epigastric abdominal pain, complicated by GERD and by pertinent comorbidities including obesity (BMI 34), GERD, Right breast infiltrating ductal carcinoma, generalized anxiety disorder, Graves disease, and seizure disorder.  - PPI, antibiotics, clear liquids diet - MRCP results reviewed and discussed with patient  - unable to get patient's surgery on OR schedule for today - check daily morning LFT's until after gallbladder has been removed             -  NPO after midnight for cholecystectomy with likely intra-operative cholangiogram tomorrow (12/28) at 7:30 am             - all risks, benefits, and alternatives to above procedure(s) were discussed with the patient, who elect(s) to proceed, all of her  questions were answered to her expressed satisfaction, and informed consent was accordingly obtained  - okay with discharge home tomorrow if late afternoon post-op LFT's remain decreased - consider GI consultation if LFT's increase or do not continue decreasing - medical management of comorbidities             - ambulation encouraged - DVT prophylaxis  All of the above findings and recommendations were discussed with the patient, and all of patient's questions were answered to her expressed satisfaction.  Thank you for the opportunity to participate in this patient's care.  -- Marilynne Drivers Rosana Hoes, MD, [place]: [facility] Office: [phone]

## 2016-02-10 NOTE — Progress Notes (Signed)
Patient ID: Tara Mcfarland, female   DOB: [birth year], 55 y.o.   MRN: [id number]  PROGRESS NOTE    Lucie Edgecombe  [id number] DOB: 1960-09-12 DOA: 02/08/2016  PCP: Vesta Mixer   Brief Narrative:  55 year old female with recent hospitalization 02/06/2016 with epigastric pain, nausea and vomiting. She was found to have elevated LFTs and abdominal ultrasound showed gallbladder sludge and cholelithiasis. She was seen by general surgery, observed overnight and then went home. She came back to ED because of ongoing nausea and abdominal pain.  Assessment & Plan:  Acute cholecystitis / abnormal LFTs - Patient presented with epigastric pain, nausea and elevated LFTs - Abdominal ultrasound showed gallbladder sludge with gallbladder wall thickening concerning for early acute cholecystitis. Also seen was indeterminant 1 cm subcapsular right lobe liver lesion and MRI or CT were recommended for further evaluation - CT abdomen showed fatty infiltration of the liver with small nodule at the lateral aspect of the right lobe of the liver, 11 x 11 x 8 mm little change in size from 2011, likely benign - MRI showed numerous gallstones along with a diffuse mild gallbladder wall thickening, no stones in the intrahepatic or extrahepatic biliary tree identified, no biliary dilatation. Lesions in the liver are technically too small to characterize and are likely benign - Continue ciprofloxacin - Appreciate surgery following - LFTs are improving - Plan for surgery today  Hypothyroidism - Continue Synthroid  Hypokalemia - Likely related to GI issues, cholecystitis - Supplemented   DVT prophylaxis:  Lovenox subcutaneous Code Status: full Family Communication:  family not at the bedside this morning Disposition Plan: discharge home once cleared by surgery    Consultants:   General surgery  Procedures:   Plan for surgery today   Antimicrobials:   Cipro 12/25  -->    Subjective: No overnight events.   Objective: Vitals:   02/09/16 0530 02/09/16 1311 02/09/16 2143 02/10/16 0621  BP: 124/63 125/67 108/63 (!) 116/50  Pulse: 74 65 69 65  Resp: 18 20 20 20   Temp: 98 F (36.7 C) 98.6 F (37 C) 98.8 F (37.1 C) 97.4 F (36.3 C)  TempSrc: Oral Oral Oral Oral  SpO2: 100% 96% 95% 96%  Weight:      Height:        Intake/Output Summary (Last 24 hours) at 02/10/16 0911 Last data filed at 02/09/16 1831  Gross per 24 hour  Intake          2766.67 ml  Output             2200 ml  Net           566.67 ml   Filed Weights   02/08/16 1832  Weight: 90.1 kg (198 lb 11.2 oz)    Examination:  General exam: Appears calm and comfortable  Respiratory system: Clear to auscultation. Respiratory effort normal. Cardiovascular system: S1 & S2 heard, RRR. No JVD, murmurs, rubs, gallops or clicks. No pedal edema. Gastrointestinal system: Abdomen is nondistended, soft and nontender. No organomegaly or masses felt. Normal bowel sounds heard. Central nervous system: Alert and oriented. No focal neurological deficits. Extremities: Symmetric 5 x 5 power. Skin: No rashes, lesions or ulcers Psychiatry: Judgement and insight appear normal. Mood & affect appropriate.   Data Reviewed: I have personally reviewed following labs and imaging studies  CBC:  Recent Labs Lab 02/06/16 0859 02/08/16 1552 02/09/16 0540  WBC 7.2 4.8 4.1  NEUTROABS 5.8 2.9  --   HGB 14.2 13.4 13.7  HCT 41.8 39.6 41.1  MCV 85.5 86.7 88.6  PLT 231 179 0000000   Basic Metabolic Panel:  Recent Labs Lab 02/06/16 0859 02/07/16 0702 02/08/16 1552 02/09/16 0540 02/10/16 0555  NA 139 138 138 139 139  K 3.7 3.9 3.5 3.5 3.4*  CL 106 107 105 105 106  CO2 23 26 22 26 27   GLUCOSE 123* 93 111* 102* 99  BUN 14 13 9 8 9   CREATININE 0.84 0.81 0.84 0.93 0.89  CALCIUM 9.6 8.2* 8.9 8.7* 8.6*   GFR: Estimated Creatinine Clearance: 77.7 mL/min (by C-G formula based on SCr of 0.89  mg/dL). Liver Function Tests:  Recent Labs Lab 02/07/16 0702 02/07/16 1626 02/08/16 1552 02/09/16 0540 02/10/16 0555  AST 888* 554* 330* 304* 176*  ALT 807* 691* 515* 472* 344*  ALKPHOS 121 118 116 137* 127*  BILITOT 3.4* 3.3* 3.1* 2.7* 1.3*  PROT 6.3* 6.6 6.8 6.5 6.2*  ALBUMIN 3.5 3.6 3.9 3.6 3.4*    Recent Labs Lab 02/06/16 0859 02/08/16 1552 02/09/16 0540  LIPASE 29 26 20    No results for input(s): AMMONIA in the last 168 hours. Coagulation Profile:  Recent Labs Lab 02/09/16 0540  INR 0.92   Cardiac Enzymes:  Recent Labs Lab 02/06/16 0905  TROPONINI <0.03   BNP (last 3 results) No results for input(s): PROBNP in the last 8760 hours. HbA1C: No results for input(s): HGBA1C in the last 72 hours. CBG: No results for input(s): GLUCAP in the last 168 hours. Lipid Profile: No results for input(s): CHOL, HDL, LDLCALC, TRIG, CHOLHDL, LDLDIRECT in the last 72 hours. Thyroid Function Tests: No results for input(s): TSH, T4TOTAL, FREET4, T3FREE, THYROIDAB in the last 72 hours. Anemia Panel: No results for input(s): VITAMINB12, FOLATE, FERRITIN, TIBC, IRON, RETICCTPCT in the last 72 hours. Urine analysis:    Component Value Date/Time   COLORURINE YELLOW 02/09/2016 0120   APPEARANCEUR CLEAR 02/09/2016 0120   LABSPEC 1.011 02/09/2016 0120   PHURINE 6.0 02/09/2016 0120   GLUCOSEU NEGATIVE 02/09/2016 0120   HGBUR NEGATIVE 02/09/2016 0120   BILIRUBINUR NEGATIVE 02/09/2016 0120   KETONESUR NEGATIVE 02/09/2016 0120   PROTEINUR NEGATIVE 02/09/2016 0120   UROBILINOGEN 0.2 09/02/2009 0000   NITRITE NEGATIVE 02/09/2016 0120   LEUKOCYTESUR NEGATIVE 02/09/2016 0120   Sepsis Labs: @LABRCNTIP (procalcitonin:4,lacticidven:4)  Culture, blood (routine x 2)     Status: None (Preliminary result)   Collection Time: 02/08/16  7:46 PM  Result Value Ref Range Status   Specimen Description BLOOD LEFT ARM  Final   Special Requests BOTTLES DRAWN AEROBIC AND ANAEROBIC 8CC EACH   Final   Culture NO GROWTH < 12 HOURS  Final   Report Status PENDING  Incomplete  Culture, blood (routine x 2)     Status: None (Preliminary result)   Collection Time: 02/08/16  7:49 PM  Result Value Ref Range Status   Specimen Description BLOOD LEFT ARM  Final   Special Requests BOTTLES DRAWN AEROBIC AND ANAEROBIC 6CC EACH  Final   Culture NO GROWTH < 12 HOURS  Final   Report Status PENDING  Incomplete  Urine culture     Status: None   Collection Time: 02/09/16  1:20 AM  Result Value Ref Range Status   Specimen Description URINE, CLEAN CATCH  Final   Special Requests Normal  Final   Culture NO GROWTH   Final   Report Status 02/10/2016 FINAL  Final  Surgical pcr screen     Status: Abnormal   Collection Time: 02/09/16  2:54  PM  Result Value Ref Range Status   MRSA, PCR NEGATIVE NEGATIVE Final   Staphylococcus aureus POSITIVE (A) NEGATIVE Final      Radiology Studies: US Abdomen Complete Result Date: 02/06/2016 Gallbladder sludge with borderline gallbladder wall thickening, which may be seen in early acute cholecystitis. The sonographic Murphy's sign however was negative, which argues against acute cholecystitis, unless the patient was medicated. Diffusely increased echogenicity of the liver which may be seen with hepatic steatosis. Indeterminate 1 cm hypoechoic subcapsular right lobe of the liver lesion. Further evaluation with MRI or CT, liver protocol, may be considered, as metastatic lesion cannot be excluded. Electronically Signed   By: Fidela Salisbury M.D.   On: 02/06/2016 10:48   Ct Abdomen Pelvis W Contrast Result Date: 02/06/2016 Fatty infiltration of liver with a small low-attenuation nodule a tthe lateral aspect of the RIGHT lobe of the liver, 11 x 11 x 8 mm, little changed from the 11 x 9 x 9 mm size in 2011, likely benign. Minimal colonic diverticulosis. No acute intra-abdominal or intrapelvic abnormalities.   Mr 3d Recon At Scanner Result Date: 02/09/2016 1.  Numerous gallstones along with diffuse mild gallbladder wall thickening Bayard Males though some of this wall thickening may be due to contraction. No stones in the intrahepatic or extrahepatic biliary tree identified. No biliary dilatation. 2. Scattered small T2 hyperintense lesions in the liver are technically too small to characterize and not assessed with contrast on today' s exam, but statistically likely to be benign. The larger of lesions, measuring up to 9 mm in diameter, have been present since at least 2011. 3. Several small right renal fluid signal intensity lesions compatible with cysts given the long-term stability. 4. Diffuse hepatic steatosis. Electronically Signed: By: Van Clines M.D. On: 02/09/2016 12:46   Mr Abdomen Mrcp Moise Boring Contast Result Date: 12/26/20171.  Numerous gallstones along with diffuse mild gallbladder wall thickening Bayard Males though some of this wall thickening may be due to contraction. No stones in the intrahepatic or extrahepatic biliary tree identified. No biliary dilatation. 2. Scattered small T2 hyperintense lesions in the liver are technically too small to characterize and not assessed with contrast on today' s exam, but statistically likely to be benign. The larger of lesions, measuring up to 9 mm in diameter, have been present since at least 2011. 3. Several small right renal fluid signal intensity lesions compatible with cysts given the long-term stability. 4. Diffuse hepatic steatosis. Electronically Signed: By: Van Clines M.D. On: 02/09/2016 12:46      Scheduled Meds: . ciprofloxacin  400 mg Intravenous Q12H  . enoxaparin (LOVENOX) injection  40 mg Subcutaneous Q24H  . levothyroxine  112 mcg Oral QAC breakfast  . magnesium oxide  400 mg Oral Daily  . pantoprazole  40 mg Oral BID AC   Continuous Infusions: . sodium chloride 1,000 mL (02/08/16 1555)  . sodium chloride 75 mL/hr at 02/09/16 0654     LOS: 2 days    Time spent: 25 minutes   Greater than 50% of the time spent on counseling and coordinating the care.   Leisa Lenz, MD [facility] Pager [phone]  If 7PM-7AM, please contact night-coverage [email or web address] Password [email or web address] 02/10/2016, 9:11 AM

## 2016-02-11 ENCOUNTER — Encounter (HOSPITAL_COMMUNITY): Payer: Self-pay | Admitting: Anesthesiology

## 2016-02-11 ENCOUNTER — Encounter (HOSPITAL_COMMUNITY)
Admission: EM | Disposition: A | Discharge status: Home / Self Care | Payer: Self-pay | Source: Home / Self Care | Attending: Internal Medicine

## 2016-02-11 ENCOUNTER — Inpatient Hospital Stay (HOSPITAL_COMMUNITY): Payer: Medicaid Other

## 2016-02-11 ENCOUNTER — Inpatient Hospital Stay (HOSPITAL_COMMUNITY): Payer: Medicaid Other | Admitting: Anesthesiology

## 2016-02-11 DIAGNOSIS — E876 Hypokalemia: Secondary | ICD-10-CM

## 2016-02-11 HISTORY — PX: CHOLECYSTECTOMY: SHX55

## 2016-02-11 LAB — COMPREHENSIVE METABOLIC PANEL
ALBUMIN: 3.5 g/dL (ref 3.5–5.0)
ALK PHOS: 122 U/L (ref 38–126)
ALT: 281 U/L — AB (ref 14–54)
ANION GAP: 7 (ref 5–15)
AST: 134 U/L — ABNORMAL HIGH (ref 15–41)
BILIRUBIN TOTAL: 1.3 mg/dL — AB (ref 0.3–1.2)
BUN: 6 mg/dL (ref 6–20)
CALCIUM: 8.9 mg/dL (ref 8.9–10.3)
CO2: 27 mmol/L (ref 22–32)
CREATININE: 0.94 mg/dL (ref 0.44–1.00)
Chloride: 105 mmol/L (ref 101–111)
GFR calc Af Amer: >60 mL/min (ref >60)
GFR calc non Af Amer: >60 mL/min (ref >60)
GLUCOSE: 98 mg/dL (ref 65–99)
Potassium: 3.6 mmol/L (ref 3.5–5.1)
Sodium: 139 mmol/L (ref 135–145)
TOTAL PROTEIN: 6.4 g/dL — AB (ref 6.5–8.1)

## 2016-02-11 LAB — CBC
HEMATOCRIT: 40.3 % (ref 36.0–46.0)
HEMOGLOBIN: 13.4 g/dL (ref 12.0–15.0)
MCH: 29.5 pg (ref 26.0–34.0)
MCHC: 33.3 g/dL (ref 30.0–36.0)
MCV: 88.6 fL (ref 78.0–100.0)
Platelets: 165 10*3/uL (ref 150–400)
RBC: 4.55 MIL/uL (ref 3.87–5.11)
RDW: 13.7 % (ref 11.5–15.5)
WBC: 5 10*3/uL (ref 4.0–10.5)

## 2016-02-11 LAB — HEPATIC FUNCTION PANEL
ALBUMIN: 3.9 g/dL (ref 3.5–5.0)
ALT: 311 U/L — ABNORMAL HIGH (ref 14–54)
AST: 166 U/L — ABNORMAL HIGH (ref 15–41)
Alkaline Phosphatase: 139 U/L — ABNORMAL HIGH (ref 38–126)
Bilirubin, Direct: 0.3 mg/dL (ref 0.1–0.5)
Indirect Bilirubin: 0.9 mg/dL (ref 0.3–0.9)
TOTAL PROTEIN: 6.9 g/dL (ref 6.5–8.1)
Total Bilirubin: 1.2 mg/dL (ref 0.3–1.2)

## 2016-02-11 LAB — MAGNESIUM: MAGNESIUM: 2 mg/dL (ref 1.7–2.4)

## 2016-02-11 SURGERY — LAPAROSCOPIC CHOLECYSTECTOMY
Anesthesia: General | Site: Abdomen

## 2016-02-11 MED ORDER — NEOSTIGMINE METHYLSULFATE 10 MG/10ML IV SOLN
INTRAVENOUS | Status: DC | PRN
  Administered 2016-02-11 (×2): 2 mg via INTRAVENOUS

## 2016-02-11 MED ORDER — BUPIVACAINE HCL (PF) 0.5 % IJ SOLN
INTRAMUSCULAR | Status: AC
  Filled 2016-02-11: qty 30

## 2016-02-11 MED ORDER — SCOPOLAMINE 1 MG/3DAYS TD PT72
1.0000 | MEDICATED_PATCH | Freq: Once | TRANSDERMAL | Status: DC
  Administered 2016-02-11: 1.5 mg via TRANSDERMAL
  Filled 2016-02-11: qty 1

## 2016-02-11 MED ORDER — MIDAZOLAM HCL 2 MG/2ML IJ SOLN
1.0000 mg | INTRAMUSCULAR | Status: DC | PRN
  Administered 2016-02-11: 2 mg via INTRAVENOUS
  Filled 2016-02-11: qty 2

## 2016-02-11 MED ORDER — SUCCINYLCHOLINE CHLORIDE 20 MG/ML IJ SOLN
INTRAMUSCULAR | Status: DC | PRN
  Administered 2016-02-11: 150 mg via INTRAVENOUS

## 2016-02-11 MED ORDER — CHLORHEXIDINE GLUCONATE CLOTH 2 % EX PADS: 6.0000 | MEDICATED_PAD | Freq: Once | CUTANEOUS | Status: DC

## 2016-02-11 MED ORDER — MUPIROCIN 2 % EX OINT
1.0000 "application " | TOPICAL_OINTMENT | Freq: Two times a day (BID) | CUTANEOUS | Status: DC
  Administered 2016-02-11 – 2016-02-12 (×2): 1 via NASAL
  Filled 2016-02-11: qty 22

## 2016-02-11 MED ORDER — LABETALOL HCL 5 MG/ML IV SOLN
INTRAVENOUS | Status: AC
  Filled 2016-02-11: qty 4

## 2016-02-11 MED ORDER — HEMOSTATIC AGENTS (NO CHARGE) OPTIME
TOPICAL | Status: DC | PRN
  Administered 2016-02-11: 1 via TOPICAL

## 2016-02-11 MED ORDER — GLYCOPYRROLATE 0.2 MG/ML IJ SOLN
INTRAMUSCULAR | Status: AC
  Filled 2016-02-11: qty 3

## 2016-02-11 MED ORDER — LABETALOL HCL 5 MG/ML IV SOLN
INTRAVENOUS | Status: DC | PRN
  Administered 2016-02-11: 5 mg via INTRAVENOUS

## 2016-02-11 MED ORDER — LIDOCAINE HCL (PF) 1 % IJ SOLN
INTRAMUSCULAR | Status: AC
  Filled 2016-02-11: qty 30

## 2016-02-11 MED ORDER — CHLORHEXIDINE GLUCONATE CLOTH 2 % EX PADS
6.0000 | MEDICATED_PAD | Freq: Every day | CUTANEOUS | Status: DC
  Administered 2016-02-11: 6 via TOPICAL

## 2016-02-11 MED ORDER — ROCURONIUM BROMIDE 50 MG/5ML IV SOLN
INTRAVENOUS | Status: AC
  Filled 2016-02-11: qty 1

## 2016-02-11 MED ORDER — IOPAMIDOL (ISOVUE-300) INJECTION 61%
INTRAVENOUS | Status: AC
  Filled 2016-02-11: qty 50

## 2016-02-11 MED ORDER — LIDOCAINE HCL 1 % IJ SOLN
INTRAMUSCULAR | Status: DC | PRN
  Administered 2016-02-11: 25 mg via INTRADERMAL

## 2016-02-11 MED ORDER — MIDAZOLAM HCL 5 MG/5ML IJ SOLN
INTRAMUSCULAR | Status: DC | PRN
  Administered 2016-02-11: 2 mg via INTRAVENOUS

## 2016-02-11 MED ORDER — PROPOFOL 10 MG/ML IV BOLUS
INTRAVENOUS | Status: DC | PRN
  Administered 2016-02-11: 150 mg via INTRAVENOUS

## 2016-02-11 MED ORDER — CIPROFLOXACIN IN D5W 400 MG/200ML IV SOLN
INTRAVENOUS | Status: DC | PRN
  Administered 2016-02-11: 400 mg via INTRAVENOUS

## 2016-02-11 MED ORDER — FENTANYL CITRATE (PF) 100 MCG/2ML IJ SOLN
INTRAMUSCULAR | Status: DC | PRN
  Administered 2016-02-11: 100 ug via INTRAVENOUS
  Administered 2016-02-11 (×5): 50 ug via INTRAVENOUS

## 2016-02-11 MED ORDER — DIPHENHYDRAMINE HCL 50 MG/ML IJ SOLN
12.5000 mg | Freq: Once | INTRAMUSCULAR | Status: AC
  Administered 2016-02-11: 12.5 mg via INTRAVENOUS
  Filled 2016-02-11: qty 1

## 2016-02-11 MED ORDER — GLYCOPYRROLATE 0.2 MG/ML IJ SOLN
INTRAMUSCULAR | Status: DC | PRN
  Administered 2016-02-11: 0.6 mg via INTRAVENOUS

## 2016-02-11 MED ORDER — DEXTROSE 5 % IV SOLN
INTRAVENOUS | Status: DC | PRN
  Administered 2016-02-11: 08:00:00 via INTRAVENOUS

## 2016-02-11 MED ORDER — ROCURONIUM BROMIDE 100 MG/10ML IV SOLN
INTRAVENOUS | Status: DC | PRN
  Administered 2016-02-11: 5 mg via INTRAVENOUS
  Administered 2016-02-11: 25 mg via INTRAVENOUS
  Administered 2016-02-11: 10 mg via INTRAVENOUS

## 2016-02-11 MED ORDER — FENTANYL CITRATE (PF) 250 MCG/5ML IJ SOLN
INTRAMUSCULAR | Status: AC
  Filled 2016-02-11: qty 5

## 2016-02-11 MED ORDER — ENOXAPARIN SODIUM 40 MG/0.4ML ~~LOC~~ SOLN
40.0000 mg | SUBCUTANEOUS | Status: DC
  Administered 2016-02-12: 40 mg via SUBCUTANEOUS
  Filled 2016-02-11: qty 0.4

## 2016-02-11 MED ORDER — ONDANSETRON HCL 4 MG/2ML IJ SOLN
4.0000 mg | Freq: Once | INTRAMUSCULAR | Status: AC
  Administered 2016-02-11: 4 mg via INTRAVENOUS
  Filled 2016-02-11: qty 2

## 2016-02-11 MED ORDER — LIDOCAINE HCL 1 % IJ SOLN
INTRAMUSCULAR | Status: DC | PRN
  Administered 2016-02-11: 17 mL

## 2016-02-11 MED ORDER — HYDROMORPHONE HCL 1 MG/ML IJ SOLN
0.2500 mg | INTRAMUSCULAR | Status: DC | PRN
  Administered 2016-02-11 (×3): 0.5 mg via INTRAVENOUS

## 2016-02-11 MED ORDER — LIDOCAINE HCL (PF) 1 % IJ SOLN
INTRAMUSCULAR | Status: AC
  Filled 2016-02-11: qty 5

## 2016-02-11 MED ORDER — MIDAZOLAM HCL 2 MG/2ML IJ SOLN
INTRAMUSCULAR | Status: AC
  Filled 2016-02-11: qty 2

## 2016-02-11 MED ORDER — DEXAMETHASONE SODIUM PHOSPHATE 4 MG/ML IJ SOLN
4.0000 mg | Freq: Once | INTRAMUSCULAR | Status: AC
  Administered 2016-02-11: 4 mg via INTRAVENOUS
  Filled 2016-02-11: qty 1

## 2016-02-11 MED ORDER — LACTATED RINGERS IV SOLN
INTRAVENOUS | Status: DC
  Administered 2016-02-11: 07:00:00 via INTRAVENOUS

## 2016-02-11 MED ORDER — MUPIROCIN 2 % EX OINT
1.0000 "application " | TOPICAL_OINTMENT | Freq: Two times a day (BID) | CUTANEOUS | Status: DC
  Administered 2016-02-11: 1 via TOPICAL
  Filled 2016-02-11: qty 22

## 2016-02-11 MED ORDER — SUCCINYLCHOLINE CHLORIDE 20 MG/ML IJ SOLN
INTRAMUSCULAR | Status: AC
  Filled 2016-02-11: qty 1

## 2016-02-11 MED ORDER — OXYCODONE-ACETAMINOPHEN 5-325 MG PO TABS: 1.0000 | ORAL_TABLET | ORAL | 0 refills | Status: DC | PRN

## 2016-02-11 MED ORDER — FENTANYL CITRATE (PF) 100 MCG/2ML IJ SOLN
INTRAMUSCULAR | Status: AC
  Filled 2016-02-11: qty 2

## 2016-02-11 MED ORDER — SODIUM CHLORIDE 0.9 % IR SOLN
Status: DC | PRN
  Administered 2016-02-11: 1000 mL

## 2016-02-11 SURGICAL SUPPLY — 47 items
APPLIER CLIP LAPSCP 10X32 DD (CLIP) ×3 IMPLANT
BAG HAMPER (MISCELLANEOUS) ×3 IMPLANT
CATH CHOLANGIOGRAM 4.5FR (CATHETERS) ×3 IMPLANT
CHLORAPREP W/TINT 26ML (MISCELLANEOUS) ×3 IMPLANT
CLOTH BEACON ORANGE TIMEOUT ST (SAFETY) ×3 IMPLANT
COVER LIGHT HANDLE STERIS (MISCELLANEOUS) ×6 IMPLANT
DECANTER SPIKE VIAL GLASS SM (MISCELLANEOUS) ×9 IMPLANT
DERMABOND ADVANCED (GAUZE/BANDAGES/DRESSINGS) ×1
DERMABOND ADVANCED .7 DNX12 (GAUZE/BANDAGES/DRESSINGS) ×2 IMPLANT
DEVICE TROCAR PUNCTURE CLOSURE (ENDOMECHANICALS) ×3 IMPLANT
ELECT REM PT RETURN 9FT ADLT (ELECTROSURGICAL) ×3
ELECTRODE REM PT RTRN 9FT ADLT (ELECTROSURGICAL) ×2 IMPLANT
FILTER SMOKE EVAC LAPAROSHD (FILTER) ×3 IMPLANT
FORMALIN 10 PREFIL 120ML (MISCELLANEOUS) ×3 IMPLANT
GLOVE BIOGEL PI IND STRL 6.5 (GLOVE) ×2 IMPLANT
GLOVE BIOGEL PI IND STRL 7.0 (GLOVE) ×2 IMPLANT
GLOVE BIOGEL PI IND STRL 7.5 (GLOVE) ×2 IMPLANT
GLOVE BIOGEL PI INDICATOR 6.5 (GLOVE) ×1
GLOVE BIOGEL PI INDICATOR 7.0 (GLOVE) ×1
GLOVE BIOGEL PI INDICATOR 7.5 (GLOVE) ×1
GLOVE ECLIPSE 6.5 STRL STRAW (GLOVE) ×3 IMPLANT
GLOVE ECLIPSE 7.0 STRL STRAW (GLOVE) ×3 IMPLANT
GLOVE EXAM NITRILE MD LF STRL (GLOVE) ×3 IMPLANT
GOWN STRL REUS W/ TWL XL LVL3 (GOWN DISPOSABLE) ×2 IMPLANT
GOWN STRL REUS W/TWL LRG LVL3 (GOWN DISPOSABLE) ×6 IMPLANT
GOWN STRL REUS W/TWL XL LVL3 (GOWN DISPOSABLE) ×1
HEMOSTAT SNOW SURGICEL 2X4 (HEMOSTASIS) ×3 IMPLANT
INST SET LAPROSCOPIC AP (KITS) ×3 IMPLANT
KIT ROOM TURNOVER APOR (KITS) ×3 IMPLANT
MANIFOLD NEPTUNE II (INSTRUMENTS) ×3 IMPLANT
NEEDLE INSUFFLATION 14GA 120MM (NEEDLE) ×3 IMPLANT
NS IRRIG 1000ML POUR BTL (IV SOLUTION) ×3 IMPLANT
PACK LAP CHOLE LZT030E (CUSTOM PROCEDURE TRAY) ×3 IMPLANT
PAD ARMBOARD 7.5X6 YLW CONV (MISCELLANEOUS) ×3 IMPLANT
POUCH SPECIMEN RETRIEVAL 10MM (ENDOMECHANICALS) ×3 IMPLANT
SET BASIN LINEN APH (SET/KITS/TRAYS/PACK) ×3 IMPLANT
SLEEVE ENDOPATH XCEL 5M (ENDOMECHANICALS) ×3 IMPLANT
SUT MNCRL AB 4-0 PS2 18 (SUTURE) ×3 IMPLANT
SUT VIC AB 4-0 PS2 27 (SUTURE) ×3 IMPLANT
SUT VICRYL 0 UR6 27IN ABS (SUTURE) ×3 IMPLANT
SUT VICRYL AB 3-0 FS1 BRD 27IN (SUTURE) ×3 IMPLANT
SYR 20CC LL (SYRINGE) ×3 IMPLANT
TROCAR ENDO BLADELESS 11MM (ENDOMECHANICALS) ×3 IMPLANT
TROCAR XCEL NON-BLD 5MMX100MML (ENDOMECHANICALS) ×3 IMPLANT
TUBE CONNECTING 12X1/4 (SUCTIONS) ×3 IMPLANT
TUBING INSUFFLATION (TUBING) ×3 IMPLANT
WARMER LAPAROSCOPE (MISCELLANEOUS) ×3 IMPLANT

## 2016-02-11 NOTE — Anesthesia Postprocedure Evaluation (Signed)
Anesthesia Post Note  Patient: Tara Mcfarland  Procedure(s) Performed: Procedure(s) (LRB): LAPAROSCOPIC CHOLECYSTECTOMY (N/A)  Patient location during evaluation: PACU Anesthesia Type: General Level of consciousness: awake, oriented and patient cooperative Pain management: pain level controlled Vital Signs Assessment: post-procedure vital signs reviewed and stable Respiratory status: spontaneous breathing, nonlabored ventilation and respiratory function stable Cardiovascular status: blood pressure returned to baseline Postop Assessment: no signs of nausea or vomiting Anesthetic complications: no     Last Vitals:  Vitals:   02/11/16 0735 02/11/16 0912  BP: 128/65 (!) 157/88  Pulse:    Resp: (!) 24 16  Temp:  36.8 C    Last Pain:  Vitals:   02/11/16 0700  TempSrc: Oral  PainSc: 0-No pain                 Dezmon Conover J

## 2016-02-11 NOTE — Anesthesia Procedure Notes (Signed)
Procedure Name: Intubation Date/Time: 02/11/2016 7:49 AM Performed by: Charmaine Downs Pre-anesthesia Checklist: Patient identified, Patient being monitored, Timeout performed, Emergency Drugs available and Suction available Patient Re-evaluated:Patient Re-evaluated prior to inductionOxygen Delivery Method: Circle System Utilized Preoxygenation: Pre-oxygenation with 100% oxygen Intubation Type: IV induction, Rapid sequence and Cricoid Pressure applied Ventilation: Mask ventilation without difficulty Laryngoscope Size: Mac and 3 Grade View: Grade II Tube type: Oral Tube size: 7.0 mm Number of attempts: 1 Airway Equipment and Method: stylet Placement Confirmation: ETT inserted through vocal cords under direct vision,  positive ETCO2 and breath sounds checked- equal and bilateral Secured at: 22 cm Tube secured with: Tape Dental Injury: Teeth and Oropharynx as per pre-operative assessment  Difficulty Due To: Difficulty was anticipated and Difficult Airway- due to limited oral opening

## 2016-02-11 NOTE — Transfer of Care (Signed)
Immediate Anesthesia Transfer of Care Note  Patient: Tara Mcfarland  Procedure(s) Performed: Procedure(s): LAPAROSCOPIC CHOLECYSTECTOMY (N/A)  Patient Location: PACU  Anesthesia Type:General  Level of Consciousness: awake and patient cooperative  Airway & Oxygen Therapy: Patient Spontanous Breathing and Patient connected to face mask oxygen  Post-op Assessment: Report given to RN, Post -op Vital signs reviewed and stable and Patient moving all extremities  Post vital signs: Reviewed and stable  Last Vitals:  Vitals:   02/11/16 0730 02/11/16 0735  BP:  128/65  Pulse:    Resp: 15 (!) 24  Temp:      Last Pain:  Vitals:   02/11/16 0700  TempSrc: Oral  PainSc: 0-No pain      Patients Stated Pain Goal: 0 ([id number])  Complications: No apparent anesthesia complications

## 2016-02-11 NOTE — Anesthesia Preprocedure Evaluation (Signed)
Anesthesia Evaluation  Patient identified by MRN, date of birth, ID band Patient awake    Reviewed: Allergy & Precautions, NPO status , Patient's Chart, lab work & pertinent test results  History of Anesthesia Complications (+) PONV and history of anesthetic complications  Airway Mallampati: III  TM Distance: >3 FB Neck ROM: Full    Dental  (+) Teeth Intact   Pulmonary former smoker,    breath sounds clear to auscultation       Cardiovascular negative cardio ROS   Rhythm:Regular Rate:Normal     Neuro/Psych  Headaches, PSYCHIATRIC DISORDERS Anxiety Depression    GI/Hepatic GERD  Medicated,(+) Hepatitis -  Endo/Other  Hypothyroidism   Renal/GU      Musculoskeletal  (+) Fibromyalgia -  Abdominal   Peds  Hematology   Anesthesia Other Findings   Reproductive/Obstetrics                             Anesthesia Physical Anesthesia Plan  ASA: III  Anesthesia Plan: General   Post-op Pain Management:    Induction: Intravenous, Rapid sequence and Cricoid pressure planned  Airway Management Planned: Oral ETT  Additional Equipment:   Intra-op Plan:   Post-operative Plan: Extubation in OR  Informed Consent: I have reviewed the patients History and Physical, chart, labs and discussed the procedure including the risks, benefits and alternatives for the proposed anesthesia with the patient or authorized representative who has indicated his/her understanding and acceptance.     Plan Discussed with: CRNA  Anesthesia Plan Comments: (Egg allergy but has had propofol without incident last year. Some nausea this AM.)        Anesthesia Quick Evaluation

## 2016-02-11 NOTE — Op Note (Signed)
SURGICAL OPERATIVE REPORT   DATE OF PROCEDURE: 02/11/2016  ATTENDING Surgeon(s): Vickie Epley, MD  ANESTHESIA: GETA  PRE-OPERATIVE DIAGNOSIS: Choledocholithiasis (K80.50)  POST-OPERATIVE DIAGNOSIS: Choledocholithiasis (K80.50)  PROCEDURE(S): (cpt's: [id number]) 1.) Laparoscopic Cholecystectomy  INTRAOPERATIVE FINDINGS: Mild pericholecystic inflammation and partially intrahepatic gallbladder  INTRAOPERATIVE FLUIDS: 700 mL crystalloid   ESTIMATED BLOOD LOSS: Minimal (<30 mL)   URINE OUTPUT: No foley  SPECIMENS: Gallbladder  IMPLANTS: None  DRAINS: None   COMPLICATIONS: None apparent   CONDITION AT COMPLETION: Hemodynamically stable and extubated  DISPOSITION: PACU   INDICATION(S) FOR PROCEDURE:  Patient is a 55 y.o. female who presented with post-prandial RUQ > epigastric abdominal pain after eating fatty foods in particular. Ultrasound suggested microcholelithiasis (biliary "sludge") with a few small stones at the base of the gallbladder. Her bilirubin was mildly elevated (1.4) 1.5 weeks earlier (from 0.8 three months prior to this presentation) and rose the next day to 3.4. MRCP did not demonstrate any filling defects or ductal dilation, and patient's bilirubin and LFT's otherwise subsequently continued to decrease. All risks, benefits, and alternatives to above elective procedures were discussed with the patient, who elected to proceed, and informed consent was accordingly obtained at that time.   DETAILS OF PROCEDURE:  Patient was brought to the operating suite and appropriately identified. General anesthesia was administered along with peri-operative prophylactic IV antibiotics, and endotracheal intubation was performed by anesthesiologist, along with NG/OG tube for gastric decompression. In supine position, operative site was prepped and draped in usual sterile fashion, and following a brief time out, initial 5 mm incision was made in a natural skin crease just above the  umbilicus. Fascia was then elevated, and a Verress needle was inserted and its proper position confirmed using aspiration and saline meniscus test.  Upon insufflation of the abdominal cavity with carbon dioxide to a well-tolerated pressure of 12-15 mmHg, 5 mm peri-umbilical port followed by laparoscope were inserted and used to inspect the abdominal cavity and its contents with no injuries from insertion of the first trochar noted. Three additional trocars were inserted, one at the epigastric position (10 mm) and two along the Right costal margin (5 mm). The table was then placed in reverse Trendelenburg position with the Right side up. Filmy adhesions between the gallbladder and omentum/duodenum/transverse colon were lysed using combined blunt and sharp dissection. The apex/dome of the gallbladder was grasped with an atraumatic grasper passed through the lateral port and retracted apically over the liver. The infundibulum was also grasped and retracted, exposing Calot's triangle. The peritoneum overlying the gallbladder infundibulum was incised and dissected free of surrounding peritoneal attachments, revealing the cystic duct and cystic artery, which were clipped twice on the patient side and once on the gallbladder specimen side close to the gallbladder. Cholangiogram was considered, but the cystic artery and a small cystic duct appeared immediately adjacent to each other. The gallbladder was then dissected from its peritoneal attachments to the liver using electrocautery, and the gallbladder was placed into a laparoscopic specimen bag and removed from the abdominal cavity via the epigastric port site. Hemostasis and secure placement of clips were confirmed, hemostatic agent was placed, and intra-peritoneal cavity was inspected with no additional findings. Endoclose laparoscopic fascial closure device was then used to re-approximate fascia at the 10 mm epigastric port site.  All ports were then removed under  direct visualization, and abdominal cavity was desuflated. All port sites were irrigated/cleaned, additional local anesthetic was injected at each incision, 3-0 Vicryl was used to re-approximate dermis at  10 mm port site(s), and subcuticular 4-0 Monocryl suture was used to re-approximate skin. Skin was then cleaned, dried, and sterile skin glue was applied. Patient was then safely able to be awakened, extubated, and transferred to PACU for post-operative monitoring and care.   I was present for all aspects of procedure, and there were no intra-operative complications apparent.

## 2016-02-11 NOTE — Progress Notes (Addendum)
Patient ID: Tara Mcfarland, female   DOB: [birth year], 55 y.o.   MRN: [id number]  PROGRESS NOTE    Kayten Cheak  [id number] DOB: 1960-04-09 DOA: 02/08/2016  PCP: Vesta Mixer   Brief Narrative:  55 year old female with recent hospitalization 02/06/2016 with epigastric pain, nausea and vomiting. She was found to have elevated LFTs and abdominal ultrasound showed gallbladder sludge and cholelithiasis. She was seen by general surgery, observed overnight and then went home. She came back to ED because of ongoing nausea and abdominal pain.  Assessment & Plan:  Acute cholecystitis / abnormal LFTs - Patient presented with epigastric pain, nausea and elevated LFTs - Abdominal ultrasound showed gallbladder sludge with gallbladder wall thickening concerning for early acute cholecystitis. Also seen was indeterminant 1 cm subcapsular right lobe liver lesion and MRI or CT were recommended for further evaluation - CT abdomen showed fatty infiltration of the liver with small nodule at the lateral aspect of the right lobe of the liver, 11 x 11 x 8 mm little change in size from 2011, likely benign - MRI showed numerous gallstones along with a diffuse mild gallbladder wall thickening, no stones in the intrahepatic or extrahepatic biliary tree identified, no biliary dilatation. Lesions in the liver are technically too small to characterize and are likely benign - Continue ciprofloxacin - Blood cx negative so far  - LFTs are improving - Plan for cholecystectomy today - Surgery following  Hypothyroidism - Continue Synthroid  Hypokalemia - Likely related to GI issues, cholecystitis - Supplemented and WNL   DVT prophylaxis:  Lovenox subcutaneous Code Status: full Family Communication:  family not at the bedside this morning Disposition Plan:  plan for surgery today   Consultants:   General surgery  Procedures:   Cholecystectomy today   Antimicrobials:   Cipro 12/25  -->    Subjective: No overnight events.   Objective: Vitals:   02/11/16 0725 02/11/16 0730 02/11/16 0735 02/11/16 0912  BP:   128/65 (!) 157/88  Pulse:      Resp: 13 15 (!) 24 16  Temp:    98.2 F (36.8 C)  TempSrc:      SpO2: 98% 95% 98% 100%  Weight:      Height:        Intake/Output Summary (Last 24 hours) at 02/11/16 1010 Last data filed at 02/11/16 [id number]  Gross per 24 hour  Intake             1520 ml  Output              715 ml  Net              805 ml   Filed Weights   02/08/16 1832 02/11/16 0700  Weight: 90.1 kg (198 lb 11.2 oz) 89.8 kg (198 lb)    Examination:  General exam: Appears calm and comfortable, No distress Respiratory system: No wheezing or rhonchi Cardiovascular system: S1 & S2 heard, rate controlled Gastrointestinal system: Appreciate bowel sounds, nontender abdomen Central nervous system: No focal deficits Extremities: No edema, pulses palpable Skin: Skin is warm and dry Psychiatry: Normal mood and behavior  Data Reviewed: I have personally reviewed following labs and imaging studies  CBC:  Recent Labs Lab 02/06/16 0859 02/08/16 1552 02/09/16 0540 02/11/16 0554  WBC 7.2 4.8 4.1 5.0  NEUTROABS 5.8 2.9  --   --   HGB 14.2 13.4 13.7 13.4  HCT 41.8 39.6 41.1 40.3  MCV 85.5 86.7 88.6 88.6  PLT 231 179  178 [id number]   Basic Metabolic Panel:  Recent Labs Lab 02/07/16 0702 02/08/16 1552 02/09/16 0540 02/10/16 0555 02/11/16 0554  NA 138 138 139 139 139  K 3.9 3.5 3.5 3.4* 3.6  CL 107 105 105 106 105  CO2 26 22 26 27 27   GLUCOSE 93 111* 102* 99 98  BUN 13 9 8 9 6   CREATININE 0.81 0.84 0.93 0.89 0.94  CALCIUM 8.2* 8.9 8.7* 8.6* 8.9  MG  --   --   --   --  2.0   GFR: Estimated Creatinine Clearance: 73.3 mL/min (by C-G formula based on SCr of 0.94 mg/dL). Liver Function Tests:  Recent Labs Lab 02/07/16 1626 02/08/16 1552 02/09/16 0540 02/10/16 0555 02/11/16 0554  AST 554* 330* 304* 176* 134*  ALT 691* 515* 472* 344* 281*    ALKPHOS 118 116 137* 127* 122  BILITOT 3.3* 3.1* 2.7* 1.3* 1.3*  PROT 6.6 6.8 6.5 6.2* 6.4*  ALBUMIN 3.6 3.9 3.6 3.4* 3.5    Recent Labs Lab 02/06/16 0859 02/08/16 1552 02/09/16 0540  LIPASE 29 26 20    No results for input(s): AMMONIA in the last 168 hours. Coagulation Profile:  Recent Labs Lab 02/09/16 0540  INR 0.92   Cardiac Enzymes:  Recent Labs Lab 02/06/16 0905  TROPONINI <0.03   BNP (last 3 results) No results for input(s): PROBNP in the last 8760 hours. HbA1C: No results for input(s): HGBA1C in the last 72 hours. CBG: No results for input(s): GLUCAP in the last 168 hours. Lipid Profile: No results for input(s): CHOL, HDL, LDLCALC, TRIG, CHOLHDL, LDLDIRECT in the last 72 hours. Thyroid Function Tests: No results for input(s): TSH, T4TOTAL, FREET4, T3FREE, THYROIDAB in the last 72 hours. Anemia Panel: No results for input(s): VITAMINB12, FOLATE, FERRITIN, TIBC, IRON, RETICCTPCT in the last 72 hours. Urine analysis:    Component Value Date/Time   COLORURINE YELLOW 02/09/2016 0120   APPEARANCEUR CLEAR 02/09/2016 0120   LABSPEC 1.011 02/09/2016 0120   PHURINE 6.0 02/09/2016 0120   GLUCOSEU NEGATIVE 02/09/2016 0120   HGBUR NEGATIVE 02/09/2016 0120   BILIRUBINUR NEGATIVE 02/09/2016 0120   KETONESUR NEGATIVE 02/09/2016 0120   PROTEINUR NEGATIVE 02/09/2016 0120   UROBILINOGEN 0.2 09/02/2009 0000   NITRITE NEGATIVE 02/09/2016 0120   LEUKOCYTESUR NEGATIVE 02/09/2016 0120   Sepsis Labs: @LABRCNTIP (procalcitonin:4,lacticidven:4)  Culture, blood (routine x 2)     Status: None (Preliminary result)   Collection Time: 02/08/16  7:46 PM  Result Value Ref Range Status   Specimen Description BLOOD LEFT ARM  Final   Special Requests BOTTLES DRAWN AEROBIC AND ANAEROBIC 8CC EACH  Final   Culture NO GROWTH < 12 HOURS  Final   Report Status PENDING  Incomplete  Culture, blood (routine x 2)     Status: None (Preliminary result)   Collection Time: 02/08/16  7:49 PM   Result Value Ref Range Status   Specimen Description BLOOD LEFT ARM  Final   Special Requests BOTTLES DRAWN AEROBIC AND ANAEROBIC 6CC EACH  Final   Culture NO GROWTH < 12 HOURS  Final   Report Status PENDING  Incomplete  Urine culture     Status: None   Collection Time: 02/09/16  1:20 AM  Result Value Ref Range Status   Specimen Description URINE, CLEAN CATCH  Final   Special Requests Normal  Final   Culture NO GROWTH   Final   Report Status 02/10/2016 FINAL  Final  Surgical pcr screen     Status: Abnormal   Collection  Time: 02/09/16  2:54 PM  Result Value Ref Range Status   MRSA, PCR NEGATIVE NEGATIVE Final   Staphylococcus aureus POSITIVE (A) NEGATIVE Final      Radiology Studies: US Abdomen Complete Result Date: 02/06/2016 Gallbladder sludge with borderline gallbladder wall thickening, which may be seen in early acute cholecystitis. The sonographic Murphy's sign however was negative, which argues against acute cholecystitis, unless the patient was medicated. Diffusely increased echogenicity of the liver which may be seen with hepatic steatosis. Indeterminate 1 cm hypoechoic subcapsular right lobe of the liver lesion. Further evaluation with MRI or CT, liver protocol, may be considered, as metastatic lesion cannot be excluded. Electronically Signed   By: Fidela Salisbury M.D.   On: 02/06/2016 10:48   Ct Abdomen Pelvis W Contrast Result Date: 02/06/2016 Fatty infiltration of liver with a small low-attenuation nodule a tthe lateral aspect of the RIGHT lobe of the liver, 11 x 11 x 8 mm, little changed from the 11 x 9 x 9 mm size in 2011, likely benign. Minimal colonic diverticulosis. No acute intra-abdominal or intrapelvic abnormalities.   Mr 3d Recon At Scanner Result Date: 02/09/2016 1. Numerous gallstones along with diffuse mild gallbladder wall thickening Bayard Males though some of this wall thickening may be due to contraction. No stones in the intrahepatic or extrahepatic  biliary tree identified. No biliary dilatation. 2. Scattered small T2 hyperintense lesions in the liver are technically too small to characterize and not assessed with contrast on today' s exam, but statistically likely to be benign. The larger of lesions, measuring up to 9 mm in diameter, have been present since at least 2011. 3. Several small right renal fluid signal intensity lesions compatible with cysts given the long-term stability. 4. Diffuse hepatic steatosis. Electronically Signed: By: Van Clines M.D. On: 02/09/2016 12:46   Mr Abdomen Mrcp Moise Boring Contast Result Date: 12/26/20171.  Numerous gallstones along with diffuse mild gallbladder wall thickening Bayard Males though some of this wall thickening may be due to contraction. No stones in the intrahepatic or extrahepatic biliary tree identified. No biliary dilatation. 2. Scattered small T2 hyperintense lesions in the liver are technically too small to characterize and not assessed with contrast on today' s exam, but statistically likely to be benign. The larger of lesions, measuring up to 9 mm in diameter, have been present since at least 2011. 3. Several small right renal fluid signal intensity lesions compatible with cysts given the long-term stability. 4. Diffuse hepatic steatosis. Electronically Signed: By: Van Clines M.D. On: 02/09/2016 12:46      Scheduled Meds: .  ciprofloxacin  400 mg Intravenous Q12H  .  levothyroxine  112 mcg Oral QAC breakfast  .  magnesium oxide  400 mg Oral Daily  . mupirocin ointment  1 application Topical BID  .  pantoprazole  40 mg Oral BID AC  . scopolamine  1 patch Transdermal Once   Continuous Infusions: . sodium chloride 1,000 mL (02/08/16 1555)  . sodium chloride 75 mL/hr at 02/09/16 0654  . lactated ringers 75 mL/hr at 02/11/16 0700     LOS: 3 days    Time spent: 25 minutes  Greater than 50% of the time spent on counseling and coordinating the care.   Leisa Lenz, MD [facility] Pager [phone]  If 7PM-7AM, please contact night-coverage [email or web address] Password [email or web address] 02/11/2016, 10:10 AM

## 2016-02-12 ENCOUNTER — Encounter (HOSPITAL_COMMUNITY): Payer: Self-pay | Admitting: Surgery

## 2016-02-12 MED ORDER — HEPARIN SOD (PORK) LOCK FLUSH 100 UNIT/ML IV SOLN
500.0000 [IU] | INTRAVENOUS | Status: AC | PRN
  Administered 2016-02-12: 500 [IU]
  Filled 2016-02-12: qty 5

## 2016-02-12 NOTE — Progress Notes (Signed)
Pt noticed a rash appear on her face last night after an infusion of Cipro Iv. She believes it may be a reaction to the Cipro, but is concerned that it may be a flare up from Lupus, which she has a history of. The rash was red and extended across the bridge of her nose and over her cheeks, resembling the "butterfly rash" of Lupus. Administered Benadryl IV which significantly reduced the redness and size of the rash on the pt's face. Ames Dura, BSN, RN 02/12/2016 8:07 AM

## 2016-02-12 NOTE — Progress Notes (Signed)
Patient discharged to home  with husband. Discharge instructions reviewed with the patient and her husband. Acknowledges understanding and Rx given to patient.

## 2016-02-12 NOTE — Discharge Summary (Signed)
Physician Discharge Summary  Nanna Journigan [id number] DOB: May 05, 1960 DOA: 02/08/2016  PCP: [name], PA-C  Admit date: 02/08/2016 Discharge date: 02/12/2016  Recommendations for Outpatient Follow-up:  1. Follow-up with surgery per scheduled appointment  Discharge Diagnoses:  Principal Problem:   Cholecystitis, acute Active Problems:   Cancer of right breast, stage 2 (HCC)   Lupus (systemic lupus erythematosus) (HCC)   Nausea   Hepatitis   Hypothyroidism   Elevated LFTs   RUQ abdominal pain   Hypokalemia    Discharge Condition: stable   Diet recommendation: as tolerated   History of present illness:  55 year old female with recent hospitalization 02/06/2016 with epigastric pain, nausea and vomiting. She was found to have elevated LFTs and abdominal ultrasound showed gallbladder sludge and cholelithiasis. She was seen by general surgery, observed overnight and then went home. She came back to ED because of ongoing nausea and abdominal pain.She subsequently underwent cholecystectomy 02/11/2016.  Hospital Course:    Assessment & Plan:  Acute cholecystitis / abnormal LFTs - Patient presented with epigastric pain, nausea and elevated LFTs - Abdominal ultrasound showed gallbladder sludge with gallbladder wall thickening concerning for early acute cholecystitis. Also seen was indeterminant 1 cm subcapsular right lobe liver lesion and MRI or CT were recommended for further evaluation - CT abdomen showed fatty infiltration of the liver with small nodule at the lateral aspect of the right lobe of the liver, 11 x 11 x 8 mm little change in size from 2011, likely benign - MRI showed numerous gallstones along with a diffuse mild gallbladder wall thickening, no stones in the intrahepatic or extrahepatic biliary tree identified, no biliary dilatation. Lesions in the liver are technically too small to characterize and are likely benign - Continue ciprofloxacin - Blood cx  negative so far  - LFTs improving - Cholecystectomy 12/28, no subsequent complications  - Stable for discharge per surgery  Hypothyroidism - Continue Synthroid  Hypokalemia - Likely related to GI issues, cholecystitis - Supplemented and WNL   DVT prophylaxis: Lovenox subcutaneous Code Status:full Family Communication: family not at the bedside this morning    Consultants:  General surgery  Procedures:  Cholecystectomy 12/28  Antimicrobials:   Cipro 12/25 -->   Signed:  Leisa Lenz, MD  [facility] 02/12/2016, 11:14 AM  Pager #: [phone]  Time spent in minutes: less than 30 minutes   Discharge Exam: Vitals:   02/12/16 0100 02/12/16 0500  BP: 124/60 (!) 117/53  Pulse: 89 77  Resp: 18 20  Temp: 98.4 F (36.9 C) 98.5 F (36.9 C)   Vitals:   02/11/16 1315 02/11/16 2100 02/12/16 0100 02/12/16 0500  BP: 131/68 (!) 141/59 124/60 (!) 117/53  Pulse: 76 82 89 77  Resp: 20 18 18 20   Temp: 98.2 F (36.8 C) 97.8 F (36.6 C) 98.4 F (36.9 C) 98.5 F (36.9 C)  TempSrc: Oral Oral Oral Oral  SpO2: 96% 100% 96% 97%  Weight:      Height:        General: Pt is alert, follows commands appropriately, not in acute distress Cardiovascular: Regular rate and rhythm, S1/S2 +, no murmurs Respiratory: Clear to auscultation bilaterally, no wheezing, no crackles, no rhonchi Abdominal: Soft, non tender, non distended, bowel sounds +, no guarding Extremities: no edema, no cyanosis, pulses palpable bilaterally DP and PT Neuro: Grossly nonfocal  Discharge Instructions  Discharge Instructions    Call MD for:  persistant nausea and vomiting    Complete by:  As directed  Call MD for:  redness, tenderness, or signs of infection (pain, swelling, redness, odor or green/yellow discharge around incision site)    Complete by:  As directed    Call MD for:  severe uncontrolled pain    Complete by:  As directed    Diet - low sodium heart healthy     Complete by:  As directed    Increase activity slowly    Complete by:  As directed      Allergies as of 02/12/2016      Reactions   Eggs Or Egg-derived Products Other (See Comments)   Severe pain In stomach area stomach swells up   Penicillins Hives   Has patient had a PCN reaction causing immediate rash, facial/tongue/throat swelling, SOB or lightheadedness with hypotension: No Has patient had a PCN reaction causing severe rash involving mucus membranes or skin necrosis: No Has patient had a PCN reaction that required hospitalization No Has patient had a PCN reaction occurring within the last 10 years: No If all of the above answers are "NO", then may proceed with Cephalosporin use.   Penicillin G Rash      Medication List    TAKE these medications   calcium carbonate 1250 (500 Ca) MG tablet Commonly known as:  OS-CAL - dosed in mg of elemental calcium Take 1 tablet by mouth daily.   cholecalciferol 1000 units tablet Commonly known as:  VITAMIN D Take 6,000 Units by mouth daily.   cyclobenzaprine 10 MG tablet Commonly known as:  FLEXERIL Take 10 mg by mouth 2 (two) times daily as needed for muscle spasms (fibrolmyalgia).   levothyroxine 112 MCG tablet Commonly known as:  SYNTHROID, LEVOTHROID Take 112 mcg by mouth daily.   magnesium oxide 400 MG tablet Commonly known as:  MAG-OX Take 400 mg by mouth daily.   oxyCODONE 5 MG immediate release tablet Commonly known as:  ROXICODONE Take 1 tablet (5 mg total) by mouth every 6 (six) hours as needed for severe pain.   oxyCODONE-acetaminophen 5-325 MG tablet Commonly known as:  ROXICET Take 1-2 tablets by mouth every 4 (four) hours as needed for severe pain.   pantoprazole 40 MG tablet Commonly known as:  PROTONIX Take 1 tablet (40 mg total) by mouth 2 (two) times daily before a meal.   promethazine 12.5 MG tablet Commonly known as:  PHENERGAN Take 1 tablet (12.5 mg total) by mouth every 6 (six) hours as needed for  nausea or vomiting.   SUMAtriptan 50 MG tablet Commonly known as:  IMITREX Take 50 mg by mouth every 2 (two) hours as needed for migraine. May repeat in 2 hours if headache persists or recurs.   traMADol 50 MG tablet Commonly known as:  ULTRAM Take 50 mg by mouth every 6 (six) hours as needed for moderate pain or severe pain.      Follow-up Information    Vickie Epley, MD. Go on 02/24/2016.   Specialty:  General Surgery Why:  at 1 pm Contact information: [facility] [id number] [phone]            The results of significant diagnostics from this hospitalization (including imaging, microbiology, ancillary and laboratory) are listed below for reference.    Significant Diagnostic Studies: US Abdomen Complete  Result Date: 02/06/2016 CLINICAL DATA:  Epigastric pain, nausea vomiting. History of right breast cancer, diagnosed in 2016. EXAM: ABDOMEN ULTRASOUND COMPLETE COMPARISON:  None. FINDINGS: Gallbladder: The gallbladder is normally distended. Layering sludge is  seen. 1 cm mobile calculus is also seen. The gallbladder wall is slightly thickened to 3.3 mm. Sonographic Murphy's sign was reported as negative. Common bile duct: Diameter: 3.3 mm. Liver: Diffusely increased echogenicity of the liver. In the dome of the right lobe of the liver there is a subcapsular 0.9 x 1.0 x 0.7 cm hypoechoic mass. IVC: No abnormality visualized. Pancreas: Visualized portion unremarkable. Spleen: Size and appearance within normal limits. Right Kidney: Length: 11.1. Echogenicity within normal limits. No mass or hydronephrosis visualized. Left Kidney: Length: 10.9. Echogenicity within normal limits. No mass or hydronephrosis visualized. Abdominal aorta: No aneurysm visualized. Other findings: None. IMPRESSION: Gallbladder sludge with borderline gallbladder wall thickening, which may be seen in early acute cholecystitis. The sonographic Murphy's sign however was negative, which  argues against acute cholecystitis, unless the patient was medicated. Diffusely increased echogenicity of the liver which may be seen with hepatic steatosis. Indeterminate 1 cm hypoechoic subcapsular right lobe of the liver lesion. Further evaluation with MRI or CT, liver protocol, may be considered, as metastatic lesion cannot be excluded. Electronically Signed   By: Fidela Salisbury M.D.   On: 02/06/2016 10:48   Ct Abdomen Pelvis W Contrast  Result Date: 02/06/2016 CLINICAL DATA:  Upper abdominal pain radiating to back and to lower abdomen with associated nausea, patient had diarrhea yesterday, sharp stabbing pain, RIGHT lobe liver mass on ultrasound, history RIGHT breast cancer post radiation therapy and chemotherapy EXAM: CT ABDOMEN AND PELVIS WITH CONTRAST TECHNIQUE: Multidetector CT imaging of the abdomen and pelvis was performed using the standard protocol following bolus administration of intravenous contrast. Sagittal and coronal MPR images reconstructed from axial data set. CONTRAST:  133mL ISOVUE-300 IOPAMIDOL (ISOVUE-300) INJECTION 61% IV. Dilute oral contrast. COMPARISON:  02/24/2015 FINDINGS: Lower chest: Minimal subpleural scarring at anterolateral RIGHT mid lung likely related to prior chest wall radiation therapy. Lung bases otherwise clear. Hepatobiliary: Fatty infiltration of liver with mild focal sparing adjacent to gallbladder fossa. Dependent calcified gallstones within gallbladder. Subcapsular low-attenuation nodule lateral RIGHT lobe liver 11 x 11 x 8 mm, likely corresponding to the ultrasound finding, unchanged from 02/24/2015 exam. This lesion measured 9 x 9 x 11 mm on 09/02/2009, overall demonstrating little changed over 6+ years. Pancreas: Normal appearance Spleen: Normal appearance. Probable tiny splenules in LEFT upper quadrant. Adrenals/Urinary Tract: Adrenal glands normal appearance. Small RIGHT renal cysts. Kidneys, kidneys, ureters, and bladder otherwise normal appearance.  Stomach/Bowel: Minimal colonic diverticulosis. Normal appendix. Stomach and small bowel loops normal appearance. Vascular/Lymphatic: Aorta normal caliber.  No adenopathy. Reproductive: Unremarkable uterus and ovaries Other: No free air or free fluid.  No hernia. Musculoskeletal: Bones demineralized. IMPRESSION: Fatty infiltration of liver with a small low-attenuation nodule a tthe lateral aspect of the RIGHT lobe of the liver, 11 x 11 x 8 mm, little changed from the 11 x 9 x 9 mm size in 2011, likely benign. Minimal colonic diverticulosis. No acute intra-abdominal or intrapelvic abnormalities. Electronically Signed   By: Lavonia Dana M.D.   On: 02/06/2016 13:23   Mr 3d Recon At Scanner  Addendum Date: 02/09/2016   ADDENDUM REPORT: 02/09/2016 14:30 ADDENDUM: Several additional series have become available in [facility]. These confirm findings in the original report and no additional finding/impressions are issued. Electronically Signed   By: Van Clines M.D.   On: 02/09/2016 14:30   Result Date: 02/09/2016 CLINICAL DATA:  Right-sided abdominal pain with elevated liver enzymes. EXAM: MRI ABDOMEN WITHOUT AND WITH CONTRAST (INCLUDING MRCP) TECHNIQUE: Multiplanar multisequence MR imaging of  the abdomen was performed both before and after the administration of intravenous contrast. Heavily T2-weighted images of the biliary and pancreatic ducts were obtained, and three-dimensional MRCP images were rendered by post processing. CONTRAST:  96mL MULTIHANCE GADOBENATE DIMEGLUMINE 529 MG/ML IV SOLN COMPARISON:  02/06/2016 CT scan FINDINGS: Despite efforts by the technologist and patient, motion artifact is present on today's exam and could not be eliminated. This reduces exam sensitivity and specificity. Lower chest: Unremarkable Hepatobiliary: There are approximately 9 small T2 hyperintense lesions scattered in the liver, largest measuring proximally 9 mm diameter. Looking back to the CT scan from 09/02/2009, at  least some of these were present on that prior exam in similar-sized. Some of the lesions are only in the 3 mm diameter range. Numerous tiny gallstones are subtle dependently in the gallbladder. Mild gallbladder wall thickening diffusely. There may be some contraction of the gallbladder. No filling defect in the biliary tree or other significant abnormality of the biliary tree noted. Pancreas:  Unremarkable Spleen:  Unremarkable Adrenals/Urinary Tract: 10 mm fluid signal intensity lesion in the right kidney lower pole, image 31/2011, not appreciably changed from 2011. 6 mm fluid signal intensity lesion in the right mid kidney anteriorly, similar to 2011, probably a small cyst. Stomach/Bowel: Unremarkable Vascular/Lymphatic:  Unremarkable Other:  No supplemental non-categorized findings. Musculoskeletal: Unremarkable IMPRESSION: 1. Numerous gallstones along with diffuse mild gallbladder wall thickening Bayard Males though some of this wall thickening may be due to contraction. No stones in the intrahepatic or extrahepatic biliary tree identified. No biliary dilatation. 2. Scattered small T2 hyperintense lesions in the liver are technically too small to characterize and not assessed with contrast on today' s exam, but statistically likely to be benign. The larger of lesions, measuring up to 9 mm in diameter, have been present since at least 2011. 3. Several small right renal fluid signal intensity lesions compatible with cysts given the long-term stability. 4. Diffuse hepatic steatosis. Electronically Signed: By: Van Clines M.D. On: 02/09/2016 12:46   Dg Bone Density  Result Date: 02/01/2016 EXAM: DUAL X-RAY ABSORPTIOMETRY (DXA) FOR BONE MINERAL DENSITY IMPRESSION: Ordering Physician:  Dr. Baird Cancer, Your patient Armando Gang completed a BMD test on 02/01/2016 using the [place] (software version: 14.10) manufactured by [employer]. The following summarizes the results  of our evaluation. PATIENT BIOGRAPHICAL: Name: ELZIE, WAHLEN Patient ID: [id number] Birth Date: 1960/03/10 Height: 64.0 in. Gender: Female Exam Date: 02/01/2016 Weight: 198.0 lbs. Indications: Caucasian, Chronic Steroid Use, Follow up Osteoporosis, Height Loss, History of Fracture (Adult), Hx Breast Ca, Hyperthyroid, Post Menopausal, Secondary Osteoporosis, Glucocorticoids (Chronic) Fractures: Elbow, T-Spine Treatments: Calcium, Synthroid, Vitamin D DENSITOMETRY RESULTS: Site      Region     Measured Date Measured Age WHO Classification Young Adult T-score BMD         %Change vs. Previous Significant Change (*) AP Spine L1-L2 02/01/2016 54.9 Osteopenia -1.1 1.027 g/cm2 - - DualFemur Neck Right 02/01/2016 54.9 Osteopenia -2.0 0.764 g/cm2 - - ASSESSMENT: BMD as determined from Femur Neck Right is 0.764 g/cm2 with a T-Score of -2.0. This patient is considered osteopenic according to [facility]) criteria. (L-3 and L-4 were excluded due to advanced degenerative changes.) World Health Organization [facility]) criteria for post-menopausal, Caucasian Women: Normal:       T-score at or above -1 SD Osteopenia:   T-score between -1 and -2.5 SD Osteoporosis: T-score at or below -2.5 SD RECOMMENDATIONS: [place] recommends that FDA-approved medial therapies  be considered in postmenopausal women and men age 46 or older with a: 1. Hip or vertebral (clinical or morphometric) fracture. 2. T-Score of < -2.5 at the spine or hip. 3. Ten-year fracture probability by FRAX of 3% or greater for hip fracture or 20% or greater for major osteoporotic fracture. All treatment decisions require clinical judgment and consideration of individual patient factors, including patient preferences, co-morbidities, previous drug use, risk factors not captured in the FRAX model (e.g. falls, vitamin D deficiency, increased bone turnover, interval significant decline in bone density) and possible under-or  over-estimation of fracture risk by FRAX. All patients should ensure an adequate intake of dietary calcium (1200 mg/d) and vitamin D (800 IU daily) unless contraindicated. FOLLOW-UP: People with diagnosed cases of osteoporosis or osteopenia should be regularly tested for bone mineral density. For patients eligible for Medicare, routine testing is allowed once every 2 years. Testing frequency can be increased for patients who have rapidly progressing disease, or for those who are receiving medical therapy to restore bone mass. I have reviewed this report, and agree with the above findings. [facility] Dear Dr. Baird Cancer, Your patient Michaelle Birks Turpen-HAYES completed a FRAX assessment on 02/01/2016 using the [place] (analysis version: 14.10) manufactured by [employer]. The following summarizes the results of our evaluation. PATIENT BIOGRAPHICAL: Name: ANE, METHOD Patient ID: [id number] Birth Date: Oct 09, 1960 Height:    64.0 in. Gender:     Female    [age]       Weight:    198.0 lbs. Ethnicity:  White                            Exam Date: 02/01/2016 FRAX* RESULTS:  (version: 3.5) 10-year Probability of Fracture1 Major Osteoporotic Fracture2 Hip Fracture 20.3% 3.0% Population: [place] (Caucasian) Risk Factors: History of Fracture (Adult), Secondary Osteoporosis, Glucocorticoids (Chronic) Based on Femur (Right) Neck BMD 1 -The 10-year probability of fracture may be lower than reported if the patient has received treatment. 2 -Major Osteoporotic Fracture: Clinical Spine, Forearm, Hip or Shoulder *FRAX is a Materials engineer of the [employer] of [employer] for Metabolic Bone Disease, a [place] (WHO) [facility]. ASSESSMENT: The probability of a major osteoporotic fracture is 20.3% within the next ten years. The probability of a hip fracture is 3.0% within the next ten years. Electronically Signed   By: Lowella Grip III M.D.    On: 02/01/2016 16:02   Mr Abdomen Mrcp Moise Boring Contast  Addendum Date: 02/09/2016   ADDENDUM REPORT: 02/09/2016 14:30 ADDENDUM: Several additional series have become available in [facility]. These confirm findings in the original report and no additional finding/impressions are issued. Electronically Signed   By: Van Clines M.D.   On: 02/09/2016 14:30   Result Date: 02/09/2016 CLINICAL DATA:  Right-sided abdominal pain with elevated liver enzymes. EXAM: MRI ABDOMEN WITHOUT AND WITH CONTRAST (INCLUDING MRCP) TECHNIQUE: Multiplanar multisequence MR imaging of the abdomen was performed both before and after the administration of intravenous contrast. Heavily T2-weighted images of the biliary and pancreatic ducts were obtained, and three-dimensional MRCP images were rendered by post processing. CONTRAST:  52mL MULTIHANCE GADOBENATE DIMEGLUMINE 529 MG/ML IV SOLN COMPARISON:  02/06/2016 CT scan FINDINGS: Despite efforts by the technologist and patient, motion artifact is present on today's exam and could not be eliminated. This reduces exam sensitivity and specificity. Lower chest: Unremarkable  Hepatobiliary: There are approximately 9 small T2 hyperintense lesions scattered in the liver, largest measuring proximally 9 mm diameter. Looking back to the CT scan from 09/02/2009, at least some of these were present on that prior exam in similar-sized. Some of the lesions are only in the 3 mm diameter range. Numerous tiny gallstones are subtle dependently in the gallbladder. Mild gallbladder wall thickening diffusely. There may be some contraction of the gallbladder. No filling defect in the biliary tree or other significant abnormality of the biliary tree noted. Pancreas:  Unremarkable Spleen:  Unremarkable Adrenals/Urinary Tract: 10 mm fluid signal intensity lesion in the right kidney lower pole, image 31/2011, not appreciably changed from 2011. 6 mm fluid signal intensity lesion in the right mid kidney  anteriorly, similar to 2011, probably a small cyst. Stomach/Bowel: Unremarkable Vascular/Lymphatic:  Unremarkable Other:  No supplemental non-categorized findings. Musculoskeletal: Unremarkable IMPRESSION: 1. Numerous gallstones along with diffuse mild gallbladder wall thickening Bayard Males though some of this wall thickening may be due to contraction. No stones in the intrahepatic or extrahepatic biliary tree identified. No biliary dilatation. 2. Scattered small T2 hyperintense lesions in the liver are technically too small to characterize and not assessed with contrast on today' s exam, but statistically likely to be benign. The larger of lesions, measuring up to 9 mm in diameter, have been present since at least 2011. 3. Several small right renal fluid signal intensity lesions compatible with cysts given the long-term stability. 4. Diffuse hepatic steatosis. Electronically Signed: By: Van Clines M.D. On: 02/09/2016 12:46    Microbiology: Recent Results (from the past 240 hour(s))  Culture, blood (routine x 2)     Status: None (Preliminary result)   Collection Time: 02/08/16  7:46 PM  Result Value Ref Range Status   Specimen Description BLOOD LEFT ARM  Final   Special Requests BOTTLES DRAWN AEROBIC AND ANAEROBIC [place]  Final   Culture NO GROWTH 4 DAYS  Final   Report Status PENDING  Incomplete  Culture, blood (routine x 2)     Status: None (Preliminary result)   Collection Time: 02/08/16  7:49 PM  Result Value Ref Range Status   Specimen Description BLOOD LEFT ARM  Final   Special Requests BOTTLES DRAWN AEROBIC AND ANAEROBIC [place]  Final   Culture NO GROWTH 4 DAYS  Final   Report Status PENDING  Incomplete  Urine culture     Status: None   Collection Time: 02/09/16  1:20 AM  Result Value Ref Range Status   Specimen Description URINE, CLEAN CATCH  Final   Special Requests Normal  Final   Culture NO GROWTH Performed at [facility]   Final   Report Status 02/10/2016 FINAL   Final  Surgical pcr screen     Status: Abnormal   Collection Time: 02/09/16  2:54 PM  Result Value Ref Range Status   MRSA, PCR NEGATIVE NEGATIVE Final   Staphylococcus aureus POSITIVE (A) NEGATIVE Final    Comment:        The Xpert SA Assay (FDA approved for NASAL specimens in patients over 31 years of age), is one component of a comprehensive surveillance program.  Test performance has been validated by [facility] for patients greater than or equal to 54 year old. It is not intended to diagnose infection nor to guide or monitor treatment. RESULT CALLED TO, READ BACK BY AND VERIFIED WITH: RUSSELL,L ON 02/09/16 AT 1955 BY LOY,C      Labs: Basic Metabolic Panel:  Recent Labs Lab 02/07/16 0702 02/08/16 1552 02/09/16 0540 02/10/16 0555 02/11/16 0554  NA 138 138 139 139 139  K 3.9 3.5 3.5 3.4* 3.6  CL 107 105 105 106 105  CO2 26 22 26 27 27   GLUCOSE 93 111* 102* 99 98  BUN 13 9 8 9 6   CREATININE 0.81 0.84 0.93 0.89 0.94  CALCIUM 8.2* 8.9 8.7* 8.6* 8.9  MG  --   --   --   --  2.0   Liver Function Tests:  Recent Labs Lab 02/08/16 1552 02/09/16 0540 02/10/16 0555 02/11/16 0554 02/11/16 1618  AST 330* 304* 176* 134* 166*  ALT 515* 472* 344* 281* 311*  ALKPHOS 116 137* 127* 122 139*  BILITOT 3.1* 2.7* 1.3* 1.3* 1.2  PROT 6.8 6.5 6.2* 6.4* 6.9  ALBUMIN 3.9 3.6 3.4* 3.5 3.9    Recent Labs Lab 02/06/16 0859 02/08/16 1552 02/09/16 0540  LIPASE 29 26 20    No results for input(s): AMMONIA in the last 168 hours. CBC:  Recent Labs Lab 02/06/16 0859 02/08/16 1552 02/09/16 0540 02/11/16 0554  WBC 7.2 4.8 4.1 5.0  NEUTROABS 5.8 2.9  --   --   HGB 14.2 13.4 13.7 13.4  HCT 41.8 39.6 41.1 40.3  MCV 85.5 86.7 88.6 88.6  PLT 231 179 178 165   Cardiac Enzymes:  Recent Labs Lab 02/06/16 0905  TROPONINI <0.03   BNP: BNP (last 3 results) No results for input(s): BNP in the last 8760 hours.  ProBNP (last 3 results) No results for input(s): PROBNP in  the last 8760 hours.  CBG: No results for input(s): GLUCAP in the last 168 hours.

## 2016-02-12 NOTE — Progress Notes (Signed)
[facility] Day(s): 4.   Post op day(s): 1 Day Post-Op.   Interval History: Patient seen and examined, complains of only mild-moderate epigastric peri-incisional pain. Patient otherwise reports facial rash last night that concerned her for lupus flare that she's experienced similar previously, but this seems to have improved overnight and patient denies fever/chills, N/V, CP, or SOB.  Review of Systems:  Constitutional: denies fever, chills  HEENT: denies cough or congestion  Respiratory: denies any shortness of breath  Cardiovascular: denies chest pain or palpitations  Gastrointestinal: abdominal pain, N/V, and bowel function as per interval history Genitourinary: denies burning with urination or urinary frequency Musculoskeletal: denies pain, decreased motor or sensation Integumentary: denies any other rashes or skin discolorations Neurological: denies HA or vision/hearing changes   Vital signs in last 24 hours: [min-max] current  Temp:  [97.7 F (36.5 C)-98.5 F (36.9 C)] 98.5 F (36.9 C) (12/29 0500) Pulse Rate:  [60-89] 77 (12/29 0500) Resp:  [16-20] 20 (12/29 0500) BP: (117-147)/(53-72) 117/53 (12/29 0500) SpO2:  [94 %-100 %] 97 % (12/29 0500)     Height: 5\' 4"  (162.6 cm) Weight: 89.8 kg (198 lb) BMI (Calculated): 34.1   Intake/Output this shift:  No intake/output data recorded.   Intake/Output last 2 shifts:  @IOLAST2SHIFTS @   Physical Exam:  Constitutional: alert, cooperative and no distress HENT: normocephalic without obvious abnormality Eyes: PERRL, EOM's grossly intact and symmetric Neuro: CN II - XII grossly intact and symmetric without deficit Respiratory: breathing non-labored at rest Cardiovascular: regular rate and sinus rhythm Gastrointestinal: soft, mild peri-incisional epigastric tenderness to palpation, incisions well-approximated without erythema or drainage Musculoskeletal: UE and LE FROM, no edema or wounds, motor and  sensation grossly intact, NT  Labs:  CMP Latest Ref Rng & Units 02/11/2016 02/11/2016 02/10/2016  Glucose 65 - 99 mg/dL - 98 99  BUN 6 - 20 mg/dL - 6 9  Creatinine 0.44 - 1.00 mg/dL - 0.94 0.89  Sodium 135 - 145 mmol/L - 139 139  Potassium 3.5 - 5.1 mmol/L - 3.6 3.4(L)  Chloride 101 - 111 mmol/L - 105 106  CO2 22 - 32 mmol/L - 27 27  Calcium 8.9 - 10.3 mg/dL - 8.9 8.6(L)  Total Protein 6.5 - 8.1 g/dL 6.9 6.4(L) 6.2(L)  Total Bilirubin 0.3 - 1.2 mg/dL 1.2 1.3(H) 1.3(H)  Alkaline Phos 38 - 126 U/L 139(H) 122 127(H)  AST 15 - 41 U/L 166(H) 134(H) 176(H)  ALT 14 - 54 U/L 311(H) 281(H) 344(H)    Imaging studies: No new pertinent imaging studies   Assessment/Plan: (ICD-10's: K80.51, K21.9) 55 y.o.femaledoing well 1 Day Post-Op s/p laparoscopic cholecystectomy for likely microcholedocholithiasis attributable to biliary "sludge" (microcholelithiasis),complicated by GERD and bypertinent comorbidities including obesity (BMI 34), GERD, Right breast infiltrating ductal carcinoma, generalized anxiety disorder, Graves disease, and seizure disorder.  - pain control prn, advance diet as tolerated             - yesterday's repeat LFT's reviewed yesterday, still okay with discharge home - outpatient surgical follow-up in 2 weeks, already scheduled and discharge instructions in chart - medical management of comorbidities, including PPI (patient requests prescription) - ambulation encouraged, DVT prophylaxis  All of the above findings and recommendations were discussed with the patient, her husband, and her medical physician, and all of patient's questions were answered to her expressed satisfaction.  Thank you for the opportunity to participate in this patient's care.  -- Marilynne Drivers Rosana Hoes, MD, [place]: [facility] Office: [phone]

## 2016-02-13 LAB — CULTURE, BLOOD (ROUTINE X 2)
CULTURE: NO GROWTH
CULTURE: NO GROWTH

## 2016-03-16 ENCOUNTER — Encounter (HOSPITAL_COMMUNITY): Payer: Medicaid Other

## 2016-03-22 ENCOUNTER — Encounter (HOSPITAL_COMMUNITY): Payer: Medicaid Other | Attending: Hematology & Oncology

## 2016-03-22 ENCOUNTER — Encounter (HOSPITAL_COMMUNITY): Payer: Self-pay

## 2016-03-22 DIAGNOSIS — Z452 Encounter for adjustment and management of vascular access device: Secondary | ICD-10-CM | POA: Diagnosis present

## 2016-03-22 DIAGNOSIS — C50911 Malignant neoplasm of unspecified site of right female breast: Secondary | ICD-10-CM | POA: Insufficient documentation

## 2016-03-22 DIAGNOSIS — C50311 Malignant neoplasm of lower-inner quadrant of right female breast: Secondary | ICD-10-CM | POA: Diagnosis not present

## 2016-03-22 MED ORDER — SODIUM CHLORIDE 0.9% FLUSH
10.0000 mL | INTRAVENOUS | Status: DC | PRN
  Administered 2016-03-22: 10 mL via INTRAVENOUS
  Filled 2016-03-22: qty 10

## 2016-03-22 MED ORDER — HEPARIN SOD (PORK) LOCK FLUSH 100 UNIT/ML IV SOLN
500.0000 [IU] | Freq: Once | INTRAVENOUS | Status: AC
  Administered 2016-03-22: 500 [IU] via INTRAVENOUS

## 2016-03-22 MED ORDER — HEPARIN SOD (PORK) LOCK FLUSH 100 UNIT/ML IV SOLN
INTRAVENOUS | Status: AC
  Filled 2016-03-22: qty 5

## 2016-03-22 NOTE — Progress Notes (Signed)
Tara Mcfarland presented for Portacath access and flush.  Portacath located left chest wall accessed with  H 20 needle.  Good blood return present.  Portacath flushed with 20ml NS and 500U/5ml Heparin and needle removed intact.  Procedure tolerated well and without incident.   

## 2016-03-22 NOTE — Patient Instructions (Signed)
[facility] at [facility] Discharge Instructions  RECOMMENDATIONS MADE BY THE CONSULTANT AND ANY TEST RESULTS WILL BE SENT TO YOUR REFERRING PHYSICIAN.  Port flush today. Return as scheduled.   Thank you for choosing [facility] at [facility] to provide your oncology and hematology care.  To afford each patient quality time with our provider, please arrive at least 15 minutes before your scheduled appointment time.    If you have a lab appointment with the Cancer Center please come in thru the  Main Entrance and check in at the main information desk  You need to re-schedule your appointment should you arrive 10 or more minutes late.  We strive to give you quality time with our providers, and arriving late affects you and other patients whose appointments are after yours.  Also, if you no show three or more times for appointments you may be dismissed from the clinic at the providers discretion.     Again, thank you for choosing [facility].  Our hope is that these requests will decrease the amount of time that you wait before being seen by our physicians.       _____________________________________________________________  Should you have questions after your visit to [facility], please contact our office at [phone] between the hours of 8:30 a.m. and 4:30 p.m.  Voicemails left after 4:30 p.m. will not be returned until the following business day.  For prescription refill requests, have your pharmacy contact our office.       Resources For Cancer Patients and their Caregivers ? American Cancer Society: Can assist with transportation, wigs, general needs, runs Look Good Feel Better.        1-888-227-6333 ? Cancer Care: Provides financial assistance, online support groups, medication/co-pay assistance.  1-800-813-HOPE (4673) ? [facility] Assists [place] cancer patients and their  families through emotional , educational and financial support.  336-427-4357 ? [place] DSS Where to apply for food stamps, Medicaid and utility assistance. 336-342-1394 ? RCATS: Transportation to medical appointments. 336-347-2287 ? Social Security Administration: May apply for disability if have a Stage IV cancer. 336-342-7796 1-800-772-1213 ? [place] Aging, Disability and Transit Services: Assists with nutrition, care and transit needs. 336-349-2343  Cancer Center Support Programs: @10RELATIVEDAYS@ > Cancer Support Group  2nd Tuesday of the month 1pm-2pm, Journey Room  > Creative Journey  3rd Tuesday of the month 1130am-1pm, Journey Room  > Look Good Feel Better  1st Wednesday of the month 10am-12 noon, Journey Room (Call American Cancer Society to register 1-800-395-5775)   

## 2016-04-28 ENCOUNTER — Encounter (HOSPITAL_COMMUNITY): Payer: Medicaid Other | Attending: Oncology | Admitting: Oncology

## 2016-04-28 ENCOUNTER — Encounter (HOSPITAL_COMMUNITY): Payer: Self-pay

## 2016-04-28 ENCOUNTER — Encounter (HOSPITAL_BASED_OUTPATIENT_CLINIC_OR_DEPARTMENT_OTHER): Payer: Medicaid Other

## 2016-04-28 VITALS — BP 132/87 | HR 75 | Temp 98.7°F | Resp 18 | Wt 198.6 lb

## 2016-04-28 DIAGNOSIS — C50911 Malignant neoplasm of unspecified site of right female breast: Secondary | ICD-10-CM

## 2016-04-28 DIAGNOSIS — Z95828 Presence of other vascular implants and grafts: Secondary | ICD-10-CM

## 2016-04-28 DIAGNOSIS — C50311 Malignant neoplasm of lower-inner quadrant of right female breast: Secondary | ICD-10-CM

## 2016-04-28 DIAGNOSIS — Z171 Estrogen receptor negative status [ER-]: Secondary | ICD-10-CM

## 2016-04-28 MED ORDER — SODIUM CHLORIDE 0.9% FLUSH
10.0000 mL | INTRAVENOUS | Status: DC | PRN
  Administered 2016-04-28: 10 mL via INTRAVENOUS
  Filled 2016-04-28: qty 10

## 2016-04-28 MED ORDER — HEPARIN SOD (PORK) LOCK FLUSH 100 UNIT/ML IV SOLN
500.0000 [IU] | Freq: Once | INTRAVENOUS | Status: AC
  Administered 2016-04-28: 500 [IU] via INTRAVENOUS

## 2016-04-28 MED ORDER — HEPARIN SOD (PORK) LOCK FLUSH 100 UNIT/ML IV SOLN
INTRAVENOUS | Status: AC
  Filled 2016-04-28: qty 5

## 2016-04-28 MED ORDER — LIDOCAINE-PRILOCAINE 2.5-2.5 % EX CREA: 1.0000 "application " | TOPICAL_CREAM | CUTANEOUS | 0 refills | Status: AC | PRN

## 2016-04-28 NOTE — Patient Instructions (Signed)
Tara Mcfarland at [facility] Discharge Instructions  RECOMMENDATIONS MADE BY THE CONSULTANT AND ANY TEST RESULTS WILL BE SENT TO YOUR REFERRING PHYSICIAN.  You were seen today by Dr. Evette Georges flush every 6-8 weeks Follow up in 3 months with labs See Amy up front for appointments   Thank you for choosing [place] at [facility] to provide your oncology and hematology care.  To afford each patient quality time with our provider, please arrive at least 15 minutes before your scheduled appointment time.    If you have a lab appointment with the [facility] please come in thru the  Main Entrance and check in at the main information desk  You need to re-schedule your appointment should you arrive 10 or more minutes late.  We strive to give you quality time with our providers, and arriving late affects you and other patients whose appointments are after yours.  Also, if you no show three or more times for appointments you may be dismissed from the clinic at the providers discretion.     Again, thank you for choosing [name].  Our hope is that these requests will decrease the amount of time that you wait before being seen by our physicians.       _____________________________________________________________  Should you have questions after your visit to [facility], please contact our office at [phone] between the hours of 8:30 a.m. and 4:30 p.m.  Voicemails left after 4:30 p.m. will not be returned until the following business day.  For prescription refill requests, have your pharmacy contact our office.       Resources For Cancer Patients and their Caregivers ? American Cancer Society: Can assist with transportation, wigs, general needs, runs Look Good Feel Better.        802-304-6924 ? Cancer Care: Provides financial assistance, online support groups, medication/co-pay assistance.  1-800-813-HOPE  239-162-4660) ? [facility] Assists [place] cancer patients and their families through emotional , educational and financial support.  570-203-9782 ? [place] DSS Where to apply for food stamps, Medicaid and utility assistance. 3142888808 ? RCATS: Transportation to medical appointments. (209)686-2048 ? Social Security Administration: May apply for disability if have a Stage IV cancer. (905)020-9857 236-370-2651 ? [employer], Disability and Transit Services: Assists with nutrition, care and transit needs. [place] Support Programs: @10RELATIVEDAYS @ > Cancer Support Group  2nd Tuesday of the month 1pm-2pm, Journey Room  > Creative Journey  3rd Tuesday of the month 1130am-1pm, Journey Room  > Look Good Feel Better  1st Wednesday of the month 10am-12 noon, Journey Room (Call Tobias to register [phone])

## 2016-04-28 NOTE — Progress Notes (Signed)
Tara Mcfarland presented for Portacath access and flush.  Portacath located left chest wall accessed with  H 20 needle.  Good blood return present.  Portacath flushed with 94ml NS and 500U/65ml Heparin and needle removed intact.  Procedure tolerated well and without incident.

## 2016-04-28 NOTE — Progress Notes (Signed)
Tara Fairy, PA-C [address]  [place] [address]  Cancer of right breast, stage 2 ([place]) - Plan: CBC with Differential, Comprehensive metabolic panel  CURRENT THERAPY: Completing radiation therapy followed by surveillance per NCCN guidelines  INTERVAL HISTORY: Tara Mcfarland 56 y.o. 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 Barry Dienes on 01/21/2015 with 0/4 lymph nodes and no LVI.  Treated at [facility] with AC x 4 followed by 9/12 cycles of weekly Taxol (02/27/2015- 07/08/2015) which was discontinued early due to skin toxicity.  S/P XRT.    Cancer of right breast, stage 2 ([place])   01/21/2015 Surgery    Right breast lumpectomy and four axillary sentinel lymph nodes with Dr. Barry Dienes. R breast cancer, lower inner quadrant       01/26/2015 Pathology Results    HER 2 -, ER -, PR - Ki67 90% Invasive Grade III ductal carcinoma 2.1 cm high grade DCIS, 0/4 LN, no LVI, no lobular neoplasia pT2 pN0      02/27/2015 - 07/08/2015 Chemotherapy    AC x 4, Weekly Taxol x 9      07/08/2015 Adverse Reaction    Last 3 weekly cycles of taxol discontinued secondary to skin toxitcity. All therapy received at [facility]      08/17/2015 - 11/04/2015 Radiation Therapy    XRT in [place], [place]      02/01/2016 Imaging    Bone density- BMD as determined from Femur Neck Right is 0.764 g/cm2 with a T-Score of -2.0. This patient is considered osteopenic according to [facility]) criteria.      Tara Mcfarland presents to the clinic today unaccompanied for continuing follow up. I personally reviewed and went over DEXA scan with the patient.  She states she was supposed to receive her Prolia in November. However, she received a letter from our clinic stating that she didn't need to get it. She had been taking it for 1.5 years piror to that.   She notes her  arms are still swollen and ache. Her right arm is sensitive to cold. When she goes to bed she cannot keep her arm out of the covers and has to keep it under the sheets.  She notes she occasionally gets sharp pain where her incision was on her right axilla. She also occasionally has pain in the middle of her chest where she had the lump.   Denies chest pain, sob, abdominal pain.   Review of Systems  Constitutional: Negative.   HENT: Negative.   Eyes: Negative.   Respiratory: Negative.  Negative for shortness of breath.   Cardiovascular: Negative.  Negative for chest pain.  Gastrointestinal: Negative for abdominal pain.  Genitourinary: Negative.   Musculoskeletal: Positive for back pain (chronic).       Edema in her arms bilaterally.  "Aches" in arms bilaterally. Right arm is sensitive to cold.  Occasional sharp pain at her right axilla incision site. Occassional pain in chest at lump site.   Skin: Negative.   Neurological: Negative.   Endo/Heme/Allergies: Negative.   All other systems reviewed and are negative.   Past Medical History:  Diagnosis Date  . Anxiety   . Cancer [facility])    right breast  . Cancer of right breast, stage 2 ([place]) 12/2014   Stage 2. Right. Chemo and radiation. Lumpectomy and 4 LN  removed.  Marland Kitchen GERD (gastroesophageal reflux disease)   . Graves disease   . Headache   . Hepatitis    Pt has not been told this diagnosis?  . Hypothyroidism   . Lupus (systemic lupus erythematosus) ([place]) 1997   Rare flare ups manifest as a rash with weakness and arthritis.  . Migraine headache without aura    frequent  . PONV (postoperative nausea and vomiting)     Past Surgical History:  Procedure Laterality Date  . BREAST LUMPECTOMY WITH RADIOACTIVE SEED AND SENTINEL LYMPH NODE BIOPSY Right 01/21/2015   Procedure: RIGHT BREAST LUMPECTOMY WITH RADIOACTIVE SEED AND RIGHT SENTINEL LYMPH NODE BIOPSY;  Surgeon: Stark Klein, MD;  Location: [facility];  Service:  General;  Laterality: Right;  . CESAREAN SECTION    . CHOLECYSTECTOMY N/A 02/11/2016   Procedure: LAPAROSCOPIC CHOLECYSTECTOMY;  Surgeon: Vickie Epley, MD;  Location: [facility];  Service: General;  Laterality: N/A;  . PORTACATH PLACEMENT Left 01/21/2015   Procedure: INSERTION PORT-A-CATH;  Surgeon: Stark Klein, MD;  Location: [facility];  Service: General;  Laterality: Left;  . TUBAL LIGATION      Family History  Problem Relation Age of Onset  . Alzheimer's disease Father   . Cardiomyopathy Father   . Diabetes Father   . Diabetes Other   . Hypertension Other   . Coronary artery disease Mother   . Arthritis Mother     Social History   Social History  . Marital status: Married    Spouse name: N/A  . Number of children: N/A  . Years of education: N/A   Social History Main Topics  . Smoking status: Former Smoker    Packs/day: 2.00    Types: Cigarettes    Start date: 81    Quit date: 1996  . Smokeless tobacco: Never Used  . Alcohol use Yes     Comment: social  . Drug use: No  . Sexual activity: Yes    Birth control/ protection: None   Other Topics Concern  . None   Social History Narrative  . None     PHYSICAL EXAMINATION  ECOG PERFORMANCE STATUS: 1 - Symptomatic but completely ambulatory  Vitals:   04/28/16 1443  BP: 132/87  Pulse: 75  Resp: 18  Temp: 98.7 F (37.1 C)   Filed Weights   04/28/16 1443  Weight: 198 lb 9.6 oz (90.1 kg)     Physical Exam  Constitutional: She is oriented to person, place, and time and well-developed, well-nourished, and in no distress.  HENT:  Head: Normocephalic and atraumatic.  Eyes: Conjunctivae and EOM are normal. Pupils are equal, round, and reactive to light.  Neck: Normal range of motion. Neck supple.  Cardiovascular: Normal rate, regular rhythm and normal heart sounds.   Pulmonary/Chest: Effort normal and breath sounds normal. Right breast exhibits no inverted nipple, no mass, no nipple  discharge, no skin change and no tenderness. Left breast exhibits no inverted nipple, no mass, no nipple discharge, no skin change and no tenderness.  Abdominal: Soft. Bowel sounds are normal.  Musculoskeletal: Normal range of motion.  Neurological: She is alert and oriented to person, place, and time. Gait normal.  Skin: Skin is warm and dry.  Nursing note and vitals reviewed.    LABORATORY DATA: CBC    Component Value Date/Time   WBC 5.0 02/11/2016 0554   RBC 4.55 02/11/2016 0554   HGB 13.4 02/11/2016 0554   HCT 40.3 02/11/2016 0554   PLT  165 02/11/2016 0554   MCV 88.6 02/11/2016 0554   MCH 29.5 02/11/2016 0554   MCHC 33.3 02/11/2016 0554   RDW 13.7 02/11/2016 0554   LYMPHSABS 1.0 02/08/2016 1552   MONOABS 0.3 02/08/2016 1552   EOSABS 0.5 02/08/2016 1552   BASOSABS 0.0 02/08/2016 1552      Chemistry      Component Value Date/Time   NA 139 02/11/2016 0554   K 3.6 02/11/2016 0554   CL 105 02/11/2016 0554   CO2 27 02/11/2016 0554   BUN 6 02/11/2016 0554   CREATININE 0.94 02/11/2016 0554      Component Value Date/Time   CALCIUM 8.9 02/11/2016 0554   ALKPHOS 139 (H) 02/11/2016 1618   AST 166 (H) 02/11/2016 1618   ALT 311 (H) 02/11/2016 1618   BILITOT 1.2 02/11/2016 1618        PENDING LABS:   RADIOGRAPHIC STUDIES: I have personally reviewed the radiological images as listed and agreed with the findings in the report. No results found.  ABDOMEN ULTRASOUND COMPLETE 02/06/2016  IMPRESSION: Gallbladder sludge with borderline gallbladder wall thickening, which may be seen in early acute cholecystitis. The sonographic Murphy's sign however was negative, which argues against acute cholecystitis, unless the patient was medicated.  Diffusely increased echogenicity of the liver which may be seen with hepatic steatosis.  Indeterminate 1 cm hypoechoic subcapsular right lobe of the liver lesion. Further evaluation with MRI or CT, liver protocol, may be considered, as  metastatic lesion cannot be excluded.  CT ABDOMEN AND PELVIS WITH CONTRAST 02/06/2016  IMPRESSION: Fatty infiltration of liver with a small low-attenuation nodule a tthe lateral aspect of the RIGHT lobe of the liver, 11 x 11 x 8 mm, little changed from the 11 x 9 x 9 mm size in 2011, likely benign.   MRI ABDOMEN WITHOUT AND WITH CONTRAST (INCLUDING MRCP) 02/09/2016  IMPRESSION: 1. Numerous gallstones along with diffuse mild gallbladder wall thickening Bayard Males though some of this wall thickening may be due to contraction. No stones in the intrahepatic or extrahepatic biliary tree identified. No biliary dilatation. 2. Scattered small T2 hyperintense lesions in the liver are technically too small to characterize and not assessed with contrast on today' s exam, but statistically likely to be benign. The larger of lesions, measuring up to 9 mm in diameter, have been present since at least 2011. 3. Several small right renal fluid signal intensity lesions compatible with cysts given the long-term stability. 4. Diffuse hepatic steatosis.  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 Barry Dienes on 01/21/2015 with 0/4 lymph nodes and no LVI.  Treated at [facility] with AC x 4 followed by 9/12 cycles of weekly Taxol (02/27/2015- 07/08/2015) which was discontinued early due to skin toxicity.  S/P XRT.   PLAN: Clinically NED on breast exam today. Negative bilateral Mammogram in 11/2015.  Continue surveillance. Port flush today. RTC in 3 months for follow up for exam and CBC,CMP.  ORDERS PLACED FOR THIS ENCOUNTER: Orders Placed This Encounter  Procedures  . CBC with Differential  . Comprehensive metabolic panel     THERAPY PLAN:  NCCN guidelines recommends the following surveillance for invasive breast cancer (2.2017):  A. History and Physical exam 1-4 times per  year as clinically appropriate for 5 years, then annually.  B. Periodic screening for changes in family history and referral to genetics counseling as  indicated  C. Educate, monitor, and refer to lymphedema management.  D. Mammography every 12 months  E. Routine imaging of reconstructed breast is not indicated.  F. In the absence of clinical signs and symptoms suggestive of recurrent disease, there is no indication for laboratory or imaging studies for metastases screening.  G. Women on Tamoxifen: annual gynecologic assessment every 12 months if uterus is present.  H. Women on aromatase inhibitor or who experience ovarian failure secondary to treatment should have monitoring of bone health with a bone mineral density determination at baseline and periodically thereafter.  I. Assess and encourage adherence to adjuvant endocrine therapy.  J. Evidence suggests that active lifestyle, healthy diet, limited alcohol intake, and achieving and maintaining an ideal body weight (20-25 BMI) may lead to optimal breast cancer outcomes.   All questions were answered. The patient knows to call the clinic with any problems, questions or concerns. We can certainly see the patient much sooner if necessary.  This document serves as a record of services personally performed by Twana First, MD. It was created on her behalf by Shirlean Mylar, a trained medical scribe. The creation of this record is based on the scribe's personal observations and the provider's statements to them. This document has been checked and approved by the attending provider.  I have reviewed the above documentation for accuracy and completeness and I agree with the above.  This note is electronically signed by: Mikey College 04/28/2016 3:04 PM

## 2016-04-28 NOTE — Patient Instructions (Signed)
[facility] at [facility] Discharge Instructions  RECOMMENDATIONS MADE BY THE CONSULTANT AND ANY TEST RESULTS WILL BE SENT TO YOUR REFERRING PHYSICIAN.  Port flush with lab work today. Return as scheduled.  Thank you for choosing [facility] at [facility] to provide your oncology and hematology care.  To afford each patient quality time with our provider, please arrive at least 15 minutes before your scheduled appointment time.    If you have a lab appointment with the Cancer Center please come in thru the  Main Entrance and check in at the main information desk  You need to re-schedule your appointment should you arrive 10 or more minutes late.  We strive to give you quality time with our providers, and arriving late affects you and other patients whose appointments are after yours.  Also, if you no show three or more times for appointments you may be dismissed from the clinic at the providers discretion.     Again, thank you for choosing [facility].  Our hope is that these requests will decrease the amount of time that you wait before being seen by our physicians.       _____________________________________________________________  Should you have questions after your visit to [facility], please contact our office at [phone] between the hours of 8:30 a.m. and 4:30 p.m.  Voicemails left after 4:30 p.m. will not be returned until the following business day.  For prescription refill requests, have your pharmacy contact our office.       Resources For Cancer Patients and their Caregivers ? American Cancer Society: Can assist with transportation, wigs, general needs, runs Look Good Feel Better.        1-888-227-6333 ? Cancer Care: Provides financial assistance, online support groups, medication/co-pay assistance.  1-800-813-HOPE (4673) ? [facility] Assists [place] cancer patients  and their families through emotional , educational and financial support.  336-427-4357 ? [place] DSS Where to apply for food stamps, Medicaid and utility assistance. 336-342-1394 ? RCATS: Transportation to medical appointments. 336-347-2287 ? Social Security Administration: May apply for disability if have a Stage IV cancer. 336-342-7796 1-800-772-1213 ? [place] Aging, Disability and Transit Services: Assists with nutrition, care and transit needs. 336-349-2343  Cancer Center Support Programs: @10RELATIVEDAYS@ > Cancer Support Group  2nd Tuesday of the month 1pm-2pm, Journey Room  > Creative Journey  3rd Tuesday of the month 1130am-1pm, Journey Room  > Look Good Feel Better  1st Wednesday of the month 10am-12 noon, Journey Room (Call American Cancer Society to register 1-800-395-5775)   

## 2016-06-09 ENCOUNTER — Encounter (HOSPITAL_COMMUNITY): Payer: Self-pay

## 2016-06-09 ENCOUNTER — Encounter (HOSPITAL_COMMUNITY): Payer: Medicaid Other | Attending: Hematology & Oncology

## 2016-06-09 VITALS — BP 114/63 | HR 81 | Temp 98.2°F | Resp 16

## 2016-06-09 DIAGNOSIS — C50911 Malignant neoplasm of unspecified site of right female breast: Secondary | ICD-10-CM | POA: Insufficient documentation

## 2016-06-09 DIAGNOSIS — Z452 Encounter for adjustment and management of vascular access device: Secondary | ICD-10-CM | POA: Diagnosis present

## 2016-06-09 DIAGNOSIS — Z95828 Presence of other vascular implants and grafts: Secondary | ICD-10-CM

## 2016-06-09 DIAGNOSIS — C50311 Malignant neoplasm of lower-inner quadrant of right female breast: Secondary | ICD-10-CM

## 2016-06-09 MED ORDER — HEPARIN SOD (PORK) LOCK FLUSH 100 UNIT/ML IV SOLN
500.0000 [IU] | Freq: Once | INTRAVENOUS | Status: AC
  Administered 2016-06-09: 500 [IU] via INTRAVENOUS
  Filled 2016-06-09: qty 5

## 2016-06-09 MED ORDER — SODIUM CHLORIDE 0.9% FLUSH
10.0000 mL | INTRAVENOUS | Status: AC | PRN
  Administered 2016-06-09: 10 mL via INTRAVENOUS
  Filled 2016-06-09: qty 10

## 2016-06-09 NOTE — Patient Instructions (Signed)
[place] at [facility] Discharge Instructions  RECOMMENDATIONS MADE BY THE CONSULTANT AND ANY TEST RESULTS WILL BE SENT TO YOUR REFERRING PHYSICIAN.  You had your port flushed today. Return in 6-8 weeks for port flush.   Thank you for choosing [place] at [facility] to provide your oncology and hematology care.  To afford each patient quality time with our provider, please arrive at least 15 minutes before your scheduled appointment time.    If you have a lab appointment with the [place] please come in thru the  Main Entrance and check in at the main information desk  You need to re-schedule your appointment should you arrive 10 or more minutes late.  We strive to give you quality time with our providers, and arriving late affects you and other patients whose appointments are after yours.  Also, if you no show three or more times for appointments you may be dismissed from the clinic at the providers discretion.     Again, thank you for choosing [facility].  Our hope is that these requests will decrease the amount of time that you wait before being seen by our physicians.       _____________________________________________________________  Should you have questions after your visit to [facility], please contact our office at [phone] between the hours of 8:30 a.m. and 4:30 p.m.  Voicemails left after 4:30 p.m. will not be returned until the following business day.  For prescription refill requests, have your pharmacy contact our office.       Resources For Cancer Patients and their Caregivers ? American Cancer Society: Can assist with transportation, wigs, general needs, runs Look Good Feel Better.        (534) 512-0485 ? Cancer Care: Provides financial assistance, online support groups, medication/co-pay assistance.  1-800-813-HOPE 7575139958) ? [facility] Assists [place] cancer patients and their families through emotional , educational and financial support.  (208) 325-2867 ? [place] DSS Where to apply for food stamps, Medicaid and utility assistance. 3075503327 ? RCATS: Transportation to medical appointments. 778-343-8369 ? Social Security Administration: May apply for disability if have a Stage IV cancer. 602 361 3682 364-173-6036 ? [employer], Disability and Transit Services: Assists with nutrition, care and transit needs. [place] Support Programs: @10RELATIVEDAYS @ > Cancer Support Group  2nd Tuesday of the month 1pm-2pm, Journey Room  > Creative Journey  3rd Tuesday of the month 1130am-1pm, Journey Room  > Look Good Feel Better  1st Wednesday of the month 10am-12 noon, Journey Room (Call [place] to register [phone])

## 2016-06-09 NOTE — Progress Notes (Unsigned)
Tara Mcfarland presented for Portacath access and flush.  Portacath located left chest wall accessed with  H 20 needle.  Good blood return present. Portacath flushed with 81ml NS and 500U/10ml Heparin and needle removed intact.  Procedure tolerated well and without incident.

## 2016-07-21 ENCOUNTER — Encounter (HOSPITAL_COMMUNITY): Payer: Medicaid Other

## 2016-07-21 ENCOUNTER — Ambulatory Visit (HOSPITAL_COMMUNITY): Payer: Medicaid Other

## 2016-07-22 ENCOUNTER — Encounter (HOSPITAL_COMMUNITY): Payer: Medicaid Other | Attending: Hematology & Oncology

## 2016-07-22 ENCOUNTER — Encounter (HOSPITAL_COMMUNITY): Payer: Self-pay | Admitting: Hematology

## 2016-07-22 ENCOUNTER — Encounter (HOSPITAL_BASED_OUTPATIENT_CLINIC_OR_DEPARTMENT_OTHER): Payer: Medicaid Other | Admitting: Hematology

## 2016-07-22 VITALS — BP 127/68 | HR 73 | Temp 98.2°F | Resp 18 | Wt 205.3 lb

## 2016-07-22 DIAGNOSIS — Z95828 Presence of other vascular implants and grafts: Secondary | ICD-10-CM

## 2016-07-22 DIAGNOSIS — C50311 Malignant neoplasm of lower-inner quadrant of right female breast: Secondary | ICD-10-CM | POA: Diagnosis present

## 2016-07-22 DIAGNOSIS — M810 Age-related osteoporosis without current pathological fracture: Secondary | ICD-10-CM

## 2016-07-22 DIAGNOSIS — Z853 Personal history of malignant neoplasm of breast: Secondary | ICD-10-CM | POA: Diagnosis present

## 2016-07-22 DIAGNOSIS — M818 Other osteoporosis without current pathological fracture: Secondary | ICD-10-CM

## 2016-07-22 DIAGNOSIS — C50911 Malignant neoplasm of unspecified site of right female breast: Secondary | ICD-10-CM

## 2016-07-22 DIAGNOSIS — M8580 Other specified disorders of bone density and structure, unspecified site: Secondary | ICD-10-CM | POA: Diagnosis not present

## 2016-07-22 DIAGNOSIS — N6315 Unspecified lump in the right breast, overlapping quadrants: Secondary | ICD-10-CM

## 2016-07-22 DIAGNOSIS — N6310 Unspecified lump in the right breast, unspecified quadrant: Secondary | ICD-10-CM

## 2016-07-22 LAB — COMPREHENSIVE METABOLIC PANEL
ALK PHOS: 124 U/L (ref 38–126)
ALT: 39 U/L (ref 14–54)
AST: 35 U/L (ref 15–41)
Albumin: 4 g/dL (ref 3.5–5.0)
Anion gap: 10 (ref 5–15)
BILIRUBIN TOTAL: 1.3 mg/dL — AB (ref 0.3–1.2)
BUN: 13 mg/dL (ref 6–20)
CO2: 25 mmol/L (ref 22–32)
CREATININE: 0.83 mg/dL (ref 0.44–1.00)
Calcium: 9.1 mg/dL (ref 8.9–10.3)
Chloride: 102 mmol/L (ref 101–111)
GFR calc Af Amer: >60 mL/min (ref >60)
GLUCOSE: 95 mg/dL (ref 65–99)
Potassium: 3.6 mmol/L (ref 3.5–5.1)
Sodium: 137 mmol/L (ref 135–145)
TOTAL PROTEIN: 7.2 g/dL (ref 6.5–8.1)

## 2016-07-22 LAB — CBC WITH DIFFERENTIAL/PLATELET
BASOS ABS: 0 10*3/uL (ref 0.0–0.1)
Basophils Relative: 0 %
Eosinophils Absolute: 0.3 10*3/uL (ref 0.0–0.7)
Eosinophils Relative: 4 %
HEMATOCRIT: 40.1 % (ref 36.0–46.0)
HEMOGLOBIN: 13.9 g/dL (ref 12.0–15.0)
LYMPHS PCT: 28 %
Lymphs Abs: 1.9 10*3/uL (ref 0.7–4.0)
MCH: 29.3 pg (ref 26.0–34.0)
MCHC: 34.7 g/dL (ref 30.0–36.0)
MCV: 84.4 fL (ref 78.0–100.0)
MONO ABS: 0.5 10*3/uL (ref 0.1–1.0)
Monocytes Relative: 7 %
NEUTROS ABS: 4.1 10*3/uL (ref 1.7–7.7)
NEUTROS PCT: 61 %
Platelets: 215 10*3/uL (ref 150–400)
RBC: 4.75 MIL/uL (ref 3.87–5.11)
RDW: 13.2 % (ref 11.5–15.5)
WBC: 6.8 10*3/uL (ref 4.0–10.5)

## 2016-07-22 MED ORDER — SODIUM CHLORIDE 0.9% FLUSH
10.0000 mL | INTRAVENOUS | Status: DC | PRN
  Administered 2016-07-22: 10 mL via INTRAVENOUS
  Filled 2016-07-22: qty 10

## 2016-07-22 MED ORDER — HEPARIN SOD (PORK) LOCK FLUSH 100 UNIT/ML IV SOLN
500.0000 [IU] | Freq: Once | INTRAVENOUS | Status: AC
  Administered 2016-07-22: 500 [IU] via INTRAVENOUS

## 2016-07-22 MED ORDER — HEPARIN SOD (PORK) LOCK FLUSH 100 UNIT/ML IV SOLN
INTRAVENOUS | Status: AC
  Filled 2016-07-22: qty 5

## 2016-07-22 NOTE — Progress Notes (Signed)
Tara Mcfarland presented for Portacath access and flush.  Portacath located left chest wall accessed with  H 20 needle.  Good blood return present. Portacath flushed with 22ml NS and 500U/24ml Heparin and needle removed intact.  Procedure tolerated well and without incident.  Discharged ambulatory.

## 2016-07-22 NOTE — Patient Instructions (Signed)
[facility] at [facility] Discharge Instructions  RECOMMENDATIONS MADE BY THE CONSULTANT AND ANY TEST RESULTS WILL BE SENT TO YOUR REFERRING PHYSICIAN.  You saw Dr. Kale today.  Thank you for choosing [facility] at [facility] to provide your oncology and hematology care.  To afford each patient quality time with our provider, please arrive at least 15 minutes before your scheduled appointment time.    If you have a lab appointment with the Cancer Center please come in thru the  Main Entrance and check in at the main information desk  You need to re-schedule your appointment should you arrive 10 or more minutes late.  We strive to give you quality time with our providers, and arriving late affects you and other patients whose appointments are after yours.  Also, if you no show three or more times for appointments you may be dismissed from the clinic at the providers discretion.     Again, thank you for choosing [facility].  Our hope is that these requests will decrease the amount of time that you wait before being seen by our physicians.       _____________________________________________________________  Should you have questions after your visit to [facility], please contact our office at [phone] between the hours of 8:30 a.m. and 4:30 p.m.  Voicemails left after 4:30 p.m. will not be returned until the following business day.  For prescription refill requests, have your pharmacy contact our office.       Resources For Cancer Patients and their Caregivers ? American Cancer Society: Can assist with transportation, wigs, general needs, runs Look Good Feel Better.        1-888-227-6333 ? Cancer Care: Provides financial assistance, online support groups, medication/co-pay assistance.  1-800-813-HOPE (4673) ? [facility] Assists [place] cancer patients and their families through  emotional , educational and financial support.  336-427-4357 ? [place] DSS Where to apply for food stamps, Medicaid and utility assistance. 336-342-1394 ? RCATS: Transportation to medical appointments. 336-347-2287 ? Social Security Administration: May apply for disability if have a Stage IV cancer. 336-342-7796 1-800-772-1213 ? [place] Aging, Disability and Transit Services: Assists with nutrition, care and transit needs. 336-349-2343  Cancer Center Support Programs: @10RELATIVEDAYS@ > Cancer Support Group  2nd Tuesday of the month 1pm-2pm, Journey Room  > Creative Journey  3rd Tuesday of the month 1130am-1pm, Journey Room  > Look Good Feel Better  1st Wednesday of the month 10am-12 noon, Journey Room (Call American Cancer Society to register 1-800-395-5775)    

## 2016-07-26 ENCOUNTER — Other Ambulatory Visit: Payer: Self-pay | Admitting: Hematology

## 2016-07-27 ENCOUNTER — Ambulatory Visit
Admission: RE | Admit: 2016-07-27 | Discharge: 2016-07-27 | Disposition: A | Discharge status: Home / Self Care | Payer: Medicaid Other | Source: Ambulatory Visit | Attending: Hematology | Admitting: Hematology

## 2016-07-27 ENCOUNTER — Other Ambulatory Visit (HOSPITAL_COMMUNITY): Payer: Self-pay | Admitting: Hematology

## 2016-07-27 DIAGNOSIS — N6315 Unspecified lump in the right breast, overlapping quadrants: Secondary | ICD-10-CM

## 2016-07-27 DIAGNOSIS — N6310 Unspecified lump in the right breast, unspecified quadrant: Secondary | ICD-10-CM

## 2016-07-27 DIAGNOSIS — C50911 Malignant neoplasm of unspecified site of right female breast: Secondary | ICD-10-CM

## 2016-07-27 DIAGNOSIS — R921 Mammographic calcification found on diagnostic imaging of breast: Secondary | ICD-10-CM

## 2016-07-27 HISTORY — DX: Personal history of irradiation: Z92.3

## 2016-07-27 HISTORY — PX: BREAST BIOPSY: SHX20

## 2016-07-27 HISTORY — DX: Personal history of antineoplastic chemotherapy: Z92.21

## 2016-08-02 ENCOUNTER — Ambulatory Visit (HOSPITAL_COMMUNITY): Payer: Medicaid Other

## 2016-08-02 ENCOUNTER — Encounter (HOSPITAL_COMMUNITY): Payer: Medicaid Other

## 2016-08-05 ENCOUNTER — Encounter (HOSPITAL_COMMUNITY): Payer: Medicaid Other

## 2016-08-10 NOTE — Progress Notes (Signed)
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 Barry Dienes on 01/21/2015 with 0/4 lymph nodes and no LVI.  Treated at [facility] with AC x 4 followed by 9/12 cycles of weekly Taxol (02/27/2015- 07/08/2015) which was discontinued early due to skin toxicity.  S/P XRT  Current Treatment: observation  SUMMARY OF ONCOLOGIC HISTORY:   Cancer of right breast, stage 2 ([place])   01/21/2015 Surgery    Right breast lumpectomy and four axillary sentinel lymph nodes with Dr. Barry Dienes. R breast cancer, lower inner quadrant       01/26/2015 Pathology Results    HER 2 -, ER -, PR - Ki67 90% Invasive Grade III ductal carcinoma 2.1 cm high grade DCIS, 0/4 LN, no LVI, no lobular neoplasia pT2 pN0      02/27/2015 - 07/08/2015 Chemotherapy    AC x 4, Weekly Taxol x 9      07/08/2015 Adverse Reaction    Last 3 weekly cycles of taxol discontinued secondary to skin toxitcity. All therapy received at [facility]      08/17/2015 - 11/04/2015 Radiation Therapy    XRT in [place], [place]      02/01/2016 Imaging    Bone density- BMD as determined from Femur Neck Right is 0.764 g/cm2 with a T-Score of -2.0. This patient is considered osteopenic according to [facility]) criteria.       INTERVAL HISTORY:  Patient is here for her Known history of right-sided breast cancer. Patient notes a new nodule at 3:00 position in the right breast and is understandably anxious about this. She was scheduled for a repeated diagnostic mammogram with tomo, [place] and biopsy. No palpable axillary lymph nodes. No other acute focal new symptoms. No nipple discharge. No skin changes.  REVIEW OF SYSTEMS:    10 Point review of systems of done and is negative  except as noted above.  . Past Medical History:  Diagnosis Date  . Anxiety   . Cancer [facility])    right breast  . Cancer of right breast, stage 2 ([place]) 12/2014   Stage 2. Right. Chemo and radiation. Lumpectomy and 4 LN removed.  Tara Mcfarland GERD (gastroesophageal reflux disease)   . Graves disease   . Headache   . Hepatitis    Pt has not been told this diagnosis?  . Hypothyroidism   . Lupus (systemic lupus erythematosus) ([place]) 1997   Rare flare ups manifest as a rash with weakness and arthritis.  . Migraine headache without aura    frequent  . Personal history of chemotherapy 03/02/2015   ended 07-21-15  . Personal history of radiation therapy 09/16/2015   ended 11-14-15  . PONV (postoperative nausea and vomiting)     . Past Surgical History:  Procedure Laterality Date  . BREAST LUMPECTOMY Right 01/21/2015  . BREAST LUMPECTOMY WITH RADIOACTIVE SEED AND SENTINEL LYMPH NODE BIOPSY Right 01/21/2015   Procedure: RIGHT BREAST LUMPECTOMY WITH RADIOACTIVE SEED AND RIGHT SENTINEL LYMPH NODE BIOPSY;  Surgeon: Stark Klein, MD;  Location: [facility];  Service: General;  Laterality: Right;  . CESAREAN SECTION    . CHOLECYSTECTOMY N/A 02/11/2016   Procedure: LAPAROSCOPIC CHOLECYSTECTOMY;  Surgeon: Vickie Epley, MD;  Location: [facility];  Service: General;  Laterality: N/A;  . PORTACATH PLACEMENT Left 01/21/2015   Procedure: INSERTION PORT-A-CATH;  Surgeon: Stark Klein, MD;  Location: [place] [facility];  Service: General;  Laterality: Left;  . TUBAL LIGATION      . Social History  Substance Use Topics  . Smoking status: Former Smoker    Packs/day: 2.00    Types: Cigarettes    Start date: 1    Quit date: 1996  . Smokeless tobacco: Never Used  . Alcohol use Yes     Comment: social    ALLERGIES:  is allergic to eggs or egg-derived products; penicillins; and penicillin g.  MEDICATIONS:  Current Outpatient Prescriptions  Medication Sig Dispense Refill  . Biotin 5000  MCG CAPS Take 1 capsule by mouth daily.    . calcium carbonate (OS-CAL - DOSED IN MG OF ELEMENTAL CALCIUM) 1250 (500 CA) MG tablet Take 1 tablet by mouth daily.     . cholecalciferol (VITAMIN D) 1000 UNITS tablet Take 6,000 Units by mouth daily.    . cyclobenzaprine (FLEXERIL) 10 MG tablet Take 10 mg by mouth 2 (two) times daily as needed for muscle spasms (fibrolmyalgia).    Tara Mcfarland levothyroxine (SYNTHROID, LEVOTHROID) 112 MCG tablet Take 112 mcg by mouth daily.    Tara Mcfarland lidocaine-prilocaine (EMLA) cream Apply 1 application topically as needed. 30 g 0  . magnesium oxide (MAG-OX) 400 MG tablet Take 400 mg by mouth daily.    . promethazine (PHENERGAN) 12.5 MG tablet Take 1 tablet (12.5 mg total) by mouth every 6 (six) hours as needed for nausea or vomiting. 30 tablet 0  . SUMAtriptan (IMITREX) 50 MG tablet Take 50 mg by mouth every 2 (two) hours as needed for migraine. May repeat in 2 hours if headache persists or recurs.    . traMADol (ULTRAM) 50 MG tablet Take 50 mg by mouth every 6 (six) hours as needed for moderate pain or severe pain.     No current facility-administered medications for this visit.    Facility-Administered Medications Ordered in Other Visits  Medication Dose Route Frequency Provider Last Rate Last Dose  . sodium chloride flush (NS) 0.9 % injection 10 mL  10 mL Intravenous PRN Holley Bouche, NP   10 mL at 06/09/16 1610    PHYSICAL EXAMINATION: ECOG PERFORMANCE STATUS: 1 . Vitals:   07/22/16 1447  BP: 127/68  Pulse: 73  Resp: 18  Temp: 98.2 F (36.8 C)    Filed Weights   07/22/16 1447  Weight: 205 lb 4.8 oz (93.1 kg)   .Body mass index is 35.24 kg/m.  GENERAL:alert, in no acute distress and comfortable SKIN: no acute rashes, no significant lesions EYES: conjunctiva are pink and non-injected, sclera anicteric OROPHARYNX: MMM, no exudates, no oropharyngeal erythema or ulceration NECK: supple, no JVD LYMPH:  no palpable lymphadenopathy in the cervical, axillary  or inguinal regions BREAST: Examination done with a chaperone. No palpable masses in the left breast. Right breast there is a palpable nodule at 3:00 position. No palpable regional lymphadenopathy. LUNGS: clear to auscultation b/l with normal respiratory effort HEART: regular rate & rhythm ABDOMEN:  normoactive bowel sounds , non tender, not distended. Extremity: no pedal edema PSYCH: alert & oriented x 3 with fluent speech NEURO: no focal motor/sensory deficits  LABORATORY DATA:   I have reviewed the data as listed  . CBC Latest Ref Rng & Units 07/22/2016 02/11/2016 02/09/2016  WBC 4.0 - 10.5 K/uL 6.8 5.0 4.1  Hemoglobin 12.0 - 15.0 g/dL 13.9 13.4 13.7  Hematocrit 36.0 - 46.0 % 40.1 40.3 41.1  Platelets 150 - 400 K/uL 215 165 178    . CMP Latest Ref Rng & Units 07/22/2016 02/11/2016 02/11/2016  Glucose 65 - 99 mg/dL 95 - 98  BUN 6 - 20 mg/dL 13 - 6  Creatinine 0.44 - 1.00 mg/dL 0.83 - 0.94  Sodium 135 - 145 mmol/L 137 - 139  Potassium 3.5 - 5.1 mmol/L 3.6 - 3.6  Chloride 101 - 111 mmol/L 102 - 105  CO2 22 - 32 mmol/L 25 - 27  Calcium 8.9 - 10.3 mg/dL 9.1 - 8.9  Total Protein 6.5 - 8.1 g/dL 7.2 6.9 6.4(L)  Total Bilirubin 0.3 - 1.2 mg/dL 1.3(H) 1.2 1.3(H)  Alkaline Phos 38 - 126 U/L 124 139(H) 122  AST 15 - 41 U/L 35 166(H) 134(H)  ALT 14 - 54 U/L 39 311(H) 281(H)     RADIOGRAPHIC STUDIES: I have personally reviewed the radiological images as listed and agreed with the findings in the report.  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 Barry Dienes on 01/21/2015 with 0/4 lymph nodes and no LVI.  Treated at [facility] with AC x 4 followed by 9/12 cycles of weekly Taxol (02/27/2015- 07/08/2015) which was discontinued early due to skin toxicity.  S/P XRT.  2) New rt 3 O clock position palpable breast nodule ? Recurrent malignancy vs RT changes vs fat  necrosis. PLAN -Breast examination was done in the concerning nodule was palpable at 3:00 position. -will order and schedule rt breast Tomo with [place] and Bx of palpable mass to r/o recurrent breast cancer. -port flush -B/l Diagnostic MMG and breast ultrasound and US guided biopsy ASAP (monday if possible) RTC in 4-5 days after mammogram/Biopsy with Dr Maylon Peppers  3) Abnormal LFTs - appear to have resolved on labs today. . Orders Placed This Encounter  Procedures  . MM DIAG BREAST TOMO UNI RIGHT    MEDICAID PF: 12/01/2015 [facility] -SIGNED HX BR CAN NO IMPLANTS NO SPECIAL NEEDS JRT/[facility]    Standing Status:   Future    Number of Occurrences:   1    Standing Expiration Date:   09/21/2017    Order Specific Question:   Reason for Exam (SYMPTOM  OR DIAGNOSIS REQUIRED)    Answer:   h/o rt breast triple neg breast cancer s/p treatment now with new rt breast lump for further evaluation and biopsy    Order Specific Question:   Is the patient pregnant?    Answer:   No    Order Specific Question:   Preferred imaging location?    Answer:   [facility]  . [place] RT BREAST BX W LOC DEV 1ST LESION IMG BX SPEC US GUIDE    MEDICAID PF: 12/01/2015 [facility] -SIGN SENT 07/22/16 TOMO HX BR CAN NO IMPLANTS NO SPECIAL NEEDS JRT/[facility]  **IF NEEDED**    Standing Status:   Future    Number of Occurrences:   1    Standing Expiration Date:   09/21/2017    Order Specific Question:   Reason for Exam (SYMPTOM  OR DIAGNOSIS REQUIRED)    Answer:   h/o rt breast triple neg breast cancer s/p treatment now with new rt breast lump for further evaluation and biopsy    Order Specific Question:   Preferred imaging location?  Answer:   [facility]  . US BREAST LTD UNI RIGHT INC AXILLA    MEDICAID PF: 12/01/2015 [facility] -SIGN SENT 07/22/16 TOMO HX BR CAN NO IMPLANTS NO SPECIAL NEEDS [facility]    Standing Status:   Future    Number of  Occurrences:   1    Standing Expiration Date:   09/21/2017    Order Specific Question:   Reason for Exam (SYMPTOM  OR DIAGNOSIS REQUIRED)    Answer:   h/o rt breast triple neg breast cancer s/p treatment now with new rt breast lump for further evaluation and biopsy    Order Specific Question:   Preferred imaging location?    Answer:   [facility]     I spent 20 minutes counseling the patient face to face. The total time spent in the appointment was 25 minutes and more than 50% was on counseling and direct patient cares.    Sullivan Lone MD [place] AAHIVMS [facility] Hematology/Oncology Physician [facility]  (Office):       [phone] (Work cell):  [phone] (Fax):           [phone]

## 2016-08-25 ENCOUNTER — Other Ambulatory Visit (HOSPITAL_COMMUNITY): Payer: Self-pay | Admitting: Physician Assistant

## 2016-08-25 DIAGNOSIS — M79601 Pain in right arm: Secondary | ICD-10-CM

## 2016-08-31 ENCOUNTER — Encounter (HOSPITAL_COMMUNITY): Payer: Medicaid Other | Attending: Hematology & Oncology | Admitting: Oncology

## 2016-08-31 ENCOUNTER — Encounter (HOSPITAL_COMMUNITY): Payer: Self-pay

## 2016-08-31 ENCOUNTER — Ambulatory Visit (HOSPITAL_COMMUNITY): Payer: Medicaid Other

## 2016-08-31 VITALS — BP 124/73 | HR 66 | Temp 98.2°F | Resp 18 | Ht 63.0 in | Wt 206.0 lb

## 2016-08-31 DIAGNOSIS — M329 Systemic lupus erythematosus, unspecified: Secondary | ICD-10-CM | POA: Diagnosis not present

## 2016-08-31 DIAGNOSIS — C50911 Malignant neoplasm of unspecified site of right female breast: Secondary | ICD-10-CM | POA: Insufficient documentation

## 2016-08-31 DIAGNOSIS — Z853 Personal history of malignant neoplasm of breast: Secondary | ICD-10-CM | POA: Diagnosis present

## 2016-08-31 MED ORDER — HEPARIN SOD (PORK) LOCK FLUSH 100 UNIT/ML IV SOLN
500.0000 [IU] | Freq: Once | INTRAVENOUS | Status: AC
  Administered 2016-08-31: 500 [IU] via INTRAVENOUS

## 2016-08-31 MED ORDER — SODIUM CHLORIDE 0.9% FLUSH
10.0000 mL | INTRAVENOUS | Status: DC | PRN
  Administered 2016-08-31: 10 mL via INTRAVENOUS
  Filled 2016-08-31: qty 10

## 2016-08-31 NOTE — Progress Notes (Signed)
Tara Fairy, PA-C [address]  [place] [address]  CURRENT THERAPY: Completing radiation therapy followed by surveillance per NCCN guidelines  INTERVAL HISTORY: Tara Mcfarland 56 y.o. 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 Barry Dienes on 01/21/2015 with 0/4 lymph nodes and no LVI.  Treated at [facility] with AC x 4 followed by 9/12 cycles of weekly Taxol (02/27/2015- 07/08/2015) which was discontinued early due to skin toxicity.  S/P XRT.    Cancer of right breast, stage 2 ([place])   01/21/2015 Surgery    Right breast lumpectomy and four axillary sentinel lymph nodes with Dr. Barry Dienes. R breast cancer, lower inner quadrant       01/26/2015 Pathology Results    HER 2 -, ER -, PR - Ki67 90% Invasive Grade III ductal carcinoma 2.1 cm high grade DCIS, 0/4 LN, no LVI, no lobular neoplasia pT2 pN0      02/27/2015 - 07/08/2015 Chemotherapy    AC x 4, Weekly Taxol x 9      07/08/2015 Adverse Reaction    Last 3 weekly cycles of taxol discontinued secondary to skin toxitcity. All therapy received at [facility]      08/17/2015 - 11/04/2015 Radiation Therapy    XRT in [place], [place]      02/01/2016 Imaging    Bone density- BMD as determined from Femur Neck Right is 0.764 g/cm2 with a T-Score of -2.0. This patient is considered osteopenic according to [facility]) criteria.      Patient presents for continued follow-up today to review her right breast biopsy results. Patient on her last visit have palpated mass in her right breast and 3:00 position 3 cm from the nipple. Mammogram with targeted ultrasound was performed on 07/27/16 which demonstrated 2.4 x 1.7 x 2.2 cm mixed echogenicity mass at that location. She then underwent an ultrasound guided biopsy of that mass on the same day. Pathology report from the right upper inner anterior depth  breast biopsy demonstrated fat necrosis with calcifications, no malignancy identified. Right breast biopsy of the lower inner posterior depth that demonstrated focal atypia consistent with radiation atypia, fat necrosis with calcifications, no malignancy identified. Today patient states that the mass has gotten smaller on its own and has resolved. She states that a few weeks ago she started having generalized swelling as well as diffuse joint pains, which she suspected was secondary to her lupus. She's been placed on weekly steroids and was started on gabapentin by her rheumatologist. She states that her symptoms are better today. She still complains of chronic fatigue and achy joint pains.  Review of Systems  Constitutional: Negative.   HENT: Negative.   Eyes: Negative.   Respiratory: Negative.  Negative for shortness of breath.   Cardiovascular: Negative.  Negative for chest pain.  Gastrointestinal: Negative for abdominal pain.  Genitourinary: Negative.   Musculoskeletal: Positive for back pain (chronic).       Edema in her arms bilaterally.  "Aches" in arms bilaterally. Right arm is sensitive to cold.  Occasional sharp pain at her right axilla incision site. Occassional pain in chest at lump site.   Skin: Negative.   Neurological: Negative.   Endo/Heme/Allergies: Negative.   All other systems reviewed and are negative.   Past Medical History:  Diagnosis Date  . Anxiety   . Cancer ([place])  right breast  . Cancer of right breast, stage 2 ([place]) 12/2014   Stage 2. Right. Chemo and radiation. Lumpectomy and 4 LN removed.  Marland Kitchen GERD (gastroesophageal reflux disease)   . Graves disease   . Headache   . Hepatitis    Pt has not been told this diagnosis?  . Hypothyroidism   . Lupus (systemic lupus erythematosus) ([place]) 1997   Rare flare ups manifest as a rash with weakness and arthritis.  . Migraine headache without aura    frequent  . Personal history of chemotherapy 03/02/2015   ended  07-21-15  . Personal history of radiation therapy 09/16/2015   ended 11-14-15  . PONV (postoperative nausea and vomiting)     Past Surgical History:  Procedure Laterality Date  . BREAST LUMPECTOMY Right 01/21/2015  . BREAST LUMPECTOMY WITH RADIOACTIVE SEED AND SENTINEL LYMPH NODE BIOPSY Right 01/21/2015   Procedure: RIGHT BREAST LUMPECTOMY WITH RADIOACTIVE SEED AND RIGHT SENTINEL LYMPH NODE BIOPSY;  Surgeon: Stark Klein, MD;  Location: [facility];  Service: General;  Laterality: Right;  . CESAREAN SECTION    . CHOLECYSTECTOMY N/A 02/11/2016   Procedure: LAPAROSCOPIC CHOLECYSTECTOMY;  Surgeon: Vickie Epley, MD;  Location: [facility];  Service: General;  Laterality: N/A;  . PORTACATH PLACEMENT Left 01/21/2015   Procedure: INSERTION PORT-A-CATH;  Surgeon: Stark Klein, MD;  Location: [facility];  Service: General;  Laterality: Left;  . TUBAL LIGATION      Family History  Problem Relation Age of Onset  . Alzheimer's disease Father   . Cardiomyopathy Father   . Diabetes Father   . Cancer Father   . Diabetes Other   . Hypertension Other   . Coronary artery disease Mother   . Arthritis Mother     Social History   Social History  . Marital status: Married    Spouse name: N/A  . Number of children: N/A  . Years of education: N/A   Social History Main Topics  . Smoking status: Former Smoker    Packs/day: 2.00    Types: Cigarettes    Start date: 45    Quit date: 1996  . Smokeless tobacco: Never Used  . Alcohol use Yes     Comment: social  . Drug use: No  . Sexual activity: Yes    Birth control/ protection: None   Other Topics Concern  . Not on file   Social History Narrative  . No narrative on file     PHYSICAL EXAMINATION  ECOG PERFORMANCE STATUS: 1 - Symptomatic but completely ambulatory  Vitals:   08/31/16 1513  BP: 124/73  Pulse: 66  Resp: 18  Temp: 98.2 F (36.8 C)   Filed Weights   08/31/16 1513  Weight: 206 lb (93.4 kg)       Physical Exam  Constitutional: She is oriented to person, place, and time and well-developed, well-nourished, and in no distress.  HENT:  Head: Normocephalic and atraumatic.  Eyes: Pupils are equal, round, and reactive to light. Conjunctivae and EOM are normal.  Neck: Normal range of motion. Neck supple.  Cardiovascular: Normal rate, regular rhythm and normal heart sounds.   Pulmonary/Chest: Effort normal and breath sounds normal. Right breast exhibits no inverted nipple, no mass, no nipple discharge, no skin change and no tenderness. Left breast exhibits no inverted nipple, no mass, no nipple discharge, no skin change and no tenderness.  Abdominal: Soft. Bowel sounds are normal.  Musculoskeletal: Normal range of motion.  Neurological: She is  alert and oriented to person, place, and time. Gait normal.  Skin: Skin is warm and dry.  Nursing note and vitals reviewed.    LABORATORY DATA: CBC    Component Value Date/Time   WBC 6.8 07/22/2016 1452   RBC 4.75 07/22/2016 1452   HGB 13.9 07/22/2016 1452   HCT 40.1 07/22/2016 1452   PLT 215 07/22/2016 1452   MCV 84.4 07/22/2016 1452   MCH 29.3 07/22/2016 1452   MCHC 34.7 07/22/2016 1452   RDW 13.2 07/22/2016 1452   LYMPHSABS 1.9 07/22/2016 1452   MONOABS 0.5 07/22/2016 1452   EOSABS 0.3 07/22/2016 1452   BASOSABS 0.0 07/22/2016 1452      Chemistry      Component Value Date/Time   NA 137 07/22/2016 1452   K 3.6 07/22/2016 1452   CL 102 07/22/2016 1452   CO2 25 07/22/2016 1452   BUN 13 07/22/2016 1452   CREATININE 0.83 07/22/2016 1452      Component Value Date/Time   CALCIUM 9.1 07/22/2016 1452   ALKPHOS 124 07/22/2016 1452   AST 35 07/22/2016 1452   ALT 39 07/22/2016 1452   BILITOT 1.3 (H) 07/22/2016 1452        PENDING LABS:   RADIOGRAPHIC STUDIES: I have personally reviewed the radiological images as listed and agreed with the findings in the report. No results found.  ABDOMEN ULTRASOUND COMPLETE  02/06/2016  IMPRESSION: Gallbladder sludge with borderline gallbladder wall thickening, which may be seen in early acute cholecystitis. The sonographic Murphy's sign however was negative, which argues against acute cholecystitis, unless the patient was medicated.  Diffusely increased echogenicity of the liver which may be seen with hepatic steatosis.  Indeterminate 1 cm hypoechoic subcapsular right lobe of the liver lesion. Further evaluation with MRI or CT, liver protocol, may be considered, as metastatic lesion cannot be excluded.  CT ABDOMEN AND PELVIS WITH CONTRAST 02/06/2016  IMPRESSION: Fatty infiltration of liver with a small low-attenuation nodule a tthe lateral aspect of the RIGHT lobe of the liver, 11 x 11 x 8 mm, little changed from the 11 x 9 x 9 mm size in 2011, likely benign.   MRI ABDOMEN WITHOUT AND WITH CONTRAST (INCLUDING MRCP) 02/09/2016  IMPRESSION: 1. Numerous gallstones along with diffuse mild gallbladder wall thickening Bayard Males though some of this wall thickening may be due to contraction. No stones in the intrahepatic or extrahepatic biliary tree identified. No biliary dilatation. 2. Scattered small T2 hyperintense lesions in the liver are technically too small to characterize and not assessed with contrast on today' s exam, but statistically likely to be benign. The larger of lesions, measuring up to 9 mm in diameter, have been present since at least 2011. 3. Several small right renal fluid signal intensity lesions compatible with cysts given the long-term stability. 4. Diffuse hepatic steatosis.  PATHOLOGY:   FINAL DIAGNOSIS 07/27/16: Diagnosis 1. Breast, right, needle core biopsy, upper inner, anterior depth - FAT NECROSIS WITH CALCIFICATIONS. - NO MALIGNANCY IDENTIFIED. 2. Breast, right, needle core biopsy, lower inner, posterior depth - FOCAL ATYPIA CONSISTENT WITH RADIATION ATYPIA, SEE COMMENT. - FAT NECROSIS WITH CALCIFICATIONS. - NO MALIGNANCY  IDENTIFIED. Microscopic Comment 2. The atypia is consistent with radiation atypia due to the patient's prior therapy and does not necessitate excision. Dr. Saralyn Pilar has reviewed the case. The case was called to [facility] on 07/28/2016. Vicente Males MD Pathologist, Electronic Signature (Case signed 07/28/2016)  ASSESSMENT AND PLAN:  Stage IIA Invasive ductal carcinoma of  right breast of lower inner quadrant measuring 2.1 cm, TRIPLE NEGATIVE, S/P right breast lumpectomy by Dr. Barry Dienes on 01/21/2015 with 0/4 lymph nodes and no LVI.  Treated at [facility] with AC x 4 followed by 9/12 cycles of weekly Taxol (02/27/2015- 07/08/2015) which was discontinued early due to skin toxicity.  S/P XRT.   PLAN: Reviewed patient's right breast biopsy results with her in detail. No evidence of malignancy. RTC in 4 months for follow up and exam.  Continue f/u with her rheumatologist for care of her SLE. Port flush q8 weeks. I have advised her that since she has triple negative disease, I would feel more comfortable with keeping her port in for now.     THERAPY PLAN:  NCCN guidelines recommends the following surveillance for invasive breast cancer (2.2017):  A. History and Physical exam 1-4 times per year as clinically appropriate for 5 years, then annually.  B. Periodic screening for changes in family history and referral to genetics counseling as indicated  C. Educate, monitor, and refer to lymphedema management.  D. Mammography every 12 months  E. Routine imaging of reconstructed breast is not indicated.  F. In the absence of clinical signs and symptoms suggestive of recurrent disease, there is no indication for laboratory or imaging studies for metastases screening.  G. Women on Tamoxifen: annual gynecologic assessment every 12 months if uterus is present.  H. Women on aromatase inhibitor or who experience ovarian failure secondary to treatment should have monitoring of bone health with a bone  mineral density determination at baseline and periodically thereafter.  I. Assess and encourage adherence to adjuvant endocrine therapy.  J. Evidence suggests that active lifestyle, healthy diet, limited alcohol intake, and achieving and maintaining an ideal body weight (20-25 BMI) may lead to optimal breast cancer outcomes.   All questions were answered. The patient knows to call the clinic with any problems, questions or concerns. We can certainly see the patient much sooner if necessary.    This note is electronically signed by: Twana First, MD 08/31/2016 3:15 PM

## 2016-08-31 NOTE — Progress Notes (Signed)
Tara Mcfarland presented for Portacath access and flush. Portacath located left chest wall accessed with  H 20 needle. Good blood return present. Portacath flushed with 9ml NS and 500U/45ml Heparin and needle removed intact. Procedure without incident. Patient tolerated procedure well.

## 2016-09-07 ENCOUNTER — Encounter (HOSPITAL_COMMUNITY): Payer: Self-pay

## 2016-09-07 ENCOUNTER — Ambulatory Visit (HOSPITAL_COMMUNITY): Payer: Medicaid Other

## 2016-09-16 ENCOUNTER — Encounter (HOSPITAL_COMMUNITY): Payer: Medicaid Other

## 2016-10-12 ENCOUNTER — Ambulatory Visit: Payer: Self-pay | Admitting: "Endocrinology

## 2016-10-26 ENCOUNTER — Encounter (HOSPITAL_COMMUNITY): Payer: Medicaid Other

## 2016-10-26 ENCOUNTER — Ambulatory Visit (HOSPITAL_COMMUNITY): Payer: Medicaid Other | Admitting: Adult Health

## 2016-10-28 ENCOUNTER — Encounter (HOSPITAL_COMMUNITY): Payer: Medicaid Other

## 2016-11-04 ENCOUNTER — Telehealth (HOSPITAL_COMMUNITY): Payer: Self-pay | Admitting: *Deleted

## 2016-11-04 ENCOUNTER — Encounter (HOSPITAL_COMMUNITY): Payer: Self-pay

## 2016-11-04 ENCOUNTER — Encounter (HOSPITAL_COMMUNITY): Payer: Medicaid Other | Attending: Hematology & Oncology

## 2016-11-04 VITALS — BP 137/76 | HR 68 | Temp 98.2°F | Resp 18

## 2016-11-04 DIAGNOSIS — Z23 Encounter for immunization: Secondary | ICD-10-CM | POA: Diagnosis not present

## 2016-11-04 DIAGNOSIS — C50911 Malignant neoplasm of unspecified site of right female breast: Secondary | ICD-10-CM | POA: Insufficient documentation

## 2016-11-04 DIAGNOSIS — C50311 Malignant neoplasm of lower-inner quadrant of right female breast: Secondary | ICD-10-CM | POA: Diagnosis present

## 2016-11-04 DIAGNOSIS — Z95828 Presence of other vascular implants and grafts: Secondary | ICD-10-CM

## 2016-11-04 MED ORDER — HEPARIN SOD (PORK) LOCK FLUSH 100 UNIT/ML IV SOLN
500.0000 [IU] | Freq: Once | INTRAVENOUS | Status: AC
  Administered 2016-11-04: 500 [IU] via INTRAVENOUS

## 2016-11-04 MED ORDER — INFLUENZA VAC SPLIT QUAD 0.5 ML IM SUSY
0.5000 mL | PREFILLED_SYRINGE | Freq: Once | INTRAMUSCULAR | Status: AC
  Administered 2016-11-04: 0.5 mL via INTRAMUSCULAR

## 2016-11-04 MED ORDER — INFLUENZA VAC SPLIT QUAD 0.5 ML IM SUSY
PREFILLED_SYRINGE | INTRAMUSCULAR | Status: AC
Start: 2016-11-04 — End: ?
  Filled 2016-11-04: qty 0.5

## 2016-11-04 MED ORDER — SODIUM CHLORIDE 0.9% FLUSH
10.0000 mL | INTRAVENOUS | Status: DC | PRN
  Administered 2016-11-04: 10 mL via INTRAVENOUS
  Filled 2016-11-04: qty 10

## 2016-11-04 NOTE — Progress Notes (Signed)
Tara Mcfarland tolerated portacath flush and Flu vaccine well without complaints or incident. Port accessed with 20 gauge needle with blood return noted then flushed with 10 ml NS and 5 ml Heparin easily per protocol then de-accessed. Pt requested flu vaccine. She reported last year when she took it she was having stomach issues after consuming eggs so they gave her a" vaccine without eggs". She had her gallbladder removed since then and has had no issues eating eggs. Dr. Talbert Cage was informed of this and approved for pt to get the Fluarix vaccine today. Dr. Talbert Cage also briefly spoke to pt regarding her continued memory impairment and a neurology consult was suggested. VSS Pt discharged self ambulatory in satisfactory condition

## 2016-11-04 NOTE — Patient Instructions (Signed)
[place] at [facility] Discharge Instructions  RECOMMENDATIONS MADE BY THE CONSULTANT AND ANY TEST RESULTS WILL BE SENT TO YOUR REFERRING PHYSICIAN. Portacath flushed today and Influenza vaccine given. Follow-up as scheduled. Call clinic for any questions or concerns Thank you for choosing [place] at [facility] to provide your oncology and hematology care.  To afford each patient quality time with our provider, please arrive at least 15 minutes before your scheduled appointment time.    If you have a lab appointment with the [facility] please come in thru the  Main Entrance and check in at the main information desk  You need to re-schedule your appointment should you arrive 10 or more minutes late.  We strive to give you quality time with our providers, and arriving late affects you and other patients whose appointments are after yours.  Also, if you no show three or more times for appointments you may be dismissed from the clinic at the providers discretion.     Again, thank you for choosing [facility].  Our hope is that these requests will decrease the amount of time that you wait before being seen by our physicians.       _____________________________________________________________  Should you have questions after your visit to [facility], please contact our office at [phone] between the hours of 8:30 a.m. and 4:30 p.m.  Voicemails left after 4:30 p.m. will not be returned until the following business day.  For prescription refill requests, have your pharmacy contact our office.       Resources For Cancer Patients and their Caregivers ? American Cancer Society: Can assist with transportation, wigs, general needs, runs Look Good Feel Better.        513-722-8965 ? Cancer Care: Provides financial assistance, online support groups, medication/co-pay assistance.  1-800-813-HOPE 518 824 6537) ? [place] Assists [place] cancer patients and their families through emotional , educational and financial support.  954-264-5582 ? [place] DSS Where to apply for food stamps, Medicaid and utility assistance. 778-437-5150 ? [facility]: Transportation to medical appointments. 725 586 3068 ? Social Security Administration: May apply for disability if have a Stage IV cancer. 470 691 1954 450-515-8405 ? [employer], Disability and Transit Services: Assists with nutrition, care and transit needs. [place] Support Programs: @10RELATIVEDAYS @ > Cancer Support Group  2nd Tuesday of the month 1pm-2pm, Journey Room  > Creative Journey  3rd Tuesday of the month 1130am-1pm, Journey Room  > Look Good Feel Better  1st Wednesday of the month 10am-12 noon, Journey Room (Call [place] to register [phone])

## 2016-11-15 ENCOUNTER — Other Ambulatory Visit: Payer: Self-pay | Admitting: Internal Medicine

## 2016-11-15 DIAGNOSIS — C50011 Malignant neoplasm of nipple and areola, right female breast: Secondary | ICD-10-CM

## 2016-11-23 ENCOUNTER — Telehealth (HOSPITAL_COMMUNITY): Payer: Self-pay

## 2016-11-23 ENCOUNTER — Encounter (HOSPITAL_COMMUNITY): Payer: Self-pay | Admitting: Lab

## 2016-11-23 DIAGNOSIS — C50911 Malignant neoplasm of unspecified site of right female breast: Secondary | ICD-10-CM

## 2016-11-23 NOTE — Telephone Encounter (Signed)
Patient called stating her port is hurting and the pain is radiating down her left arm to her forearm. She has had pain around her port before but it has never radiated down her arm. Denies jaw or shoulder pain. Denies swelling, redness, warmth, or trama to the area. States it started hurting last night. Reviewed with Dr. Talbert Cage, who wanted to obtain a port study. Called patient back and she refuses to come for study because she is now on [place] and they will not pay for her to come to [facility]. Patient is requesting referral to Dr. Frutoso Schatz in [place]. Reviewed with Md again and she okayed referral and said for the new oncologist to follow up on her port. She can also go to ER if needed. Patient verbalized understanding. Message sent to scheduling to make referral.

## 2016-11-23 NOTE — Progress Notes (Unsigned)
Referral sent to [facility]. Records faxed on 10/10

## 2016-12-02 ENCOUNTER — Ambulatory Visit
Admission: RE | Admit: 2016-12-02 | Discharge: 2016-12-02 | Disposition: A | Discharge status: Home / Self Care | Payer: Medicaid - Out of State | Source: Ambulatory Visit | Attending: Internal Medicine | Admitting: Internal Medicine

## 2016-12-02 DIAGNOSIS — C50011 Malignant neoplasm of nipple and areola, right female breast: Secondary | ICD-10-CM

## 2016-12-12 IMAGING — MG 2D DIGITAL DIAGNOSTIC BILATERAL MAMMOGRAM WITH CAD AND ADJUNCT T
6 of 9 series · 6 of 25 positions shown · non-contrast
Comparison: Previous exam(s).

CLINICAL DATA: History of right breast cancer in 1128 status post
lumpectomy and radiation therapy.

EXAM:
2D DIGITAL DIAGNOSTIC BILATERAL MAMMOGRAM WITH CAD AND ADJUNCT TOMO

[R CC (1 of 2)]
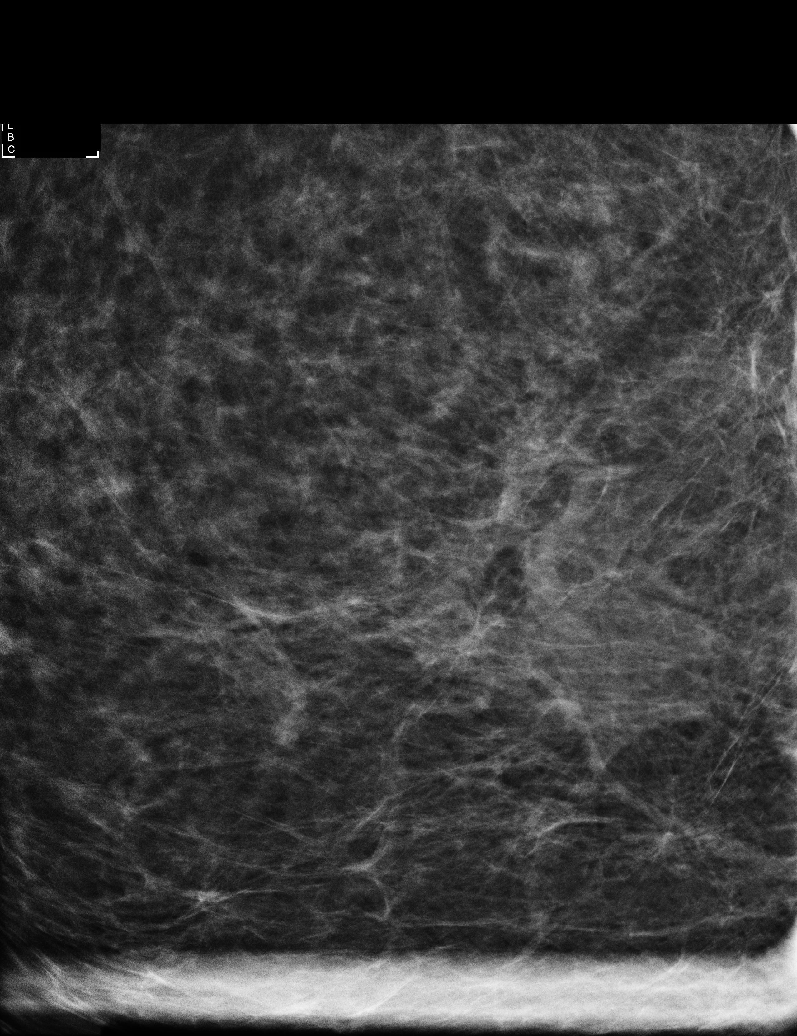

[R MLO]
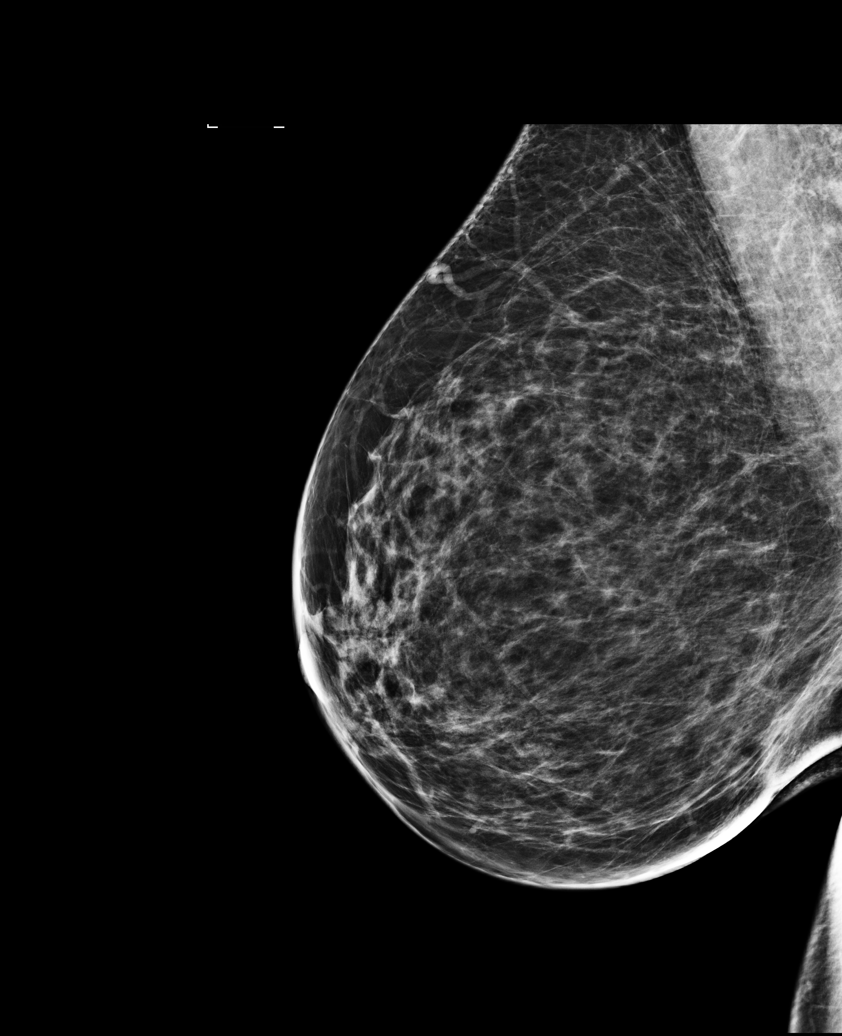

[L MLO]
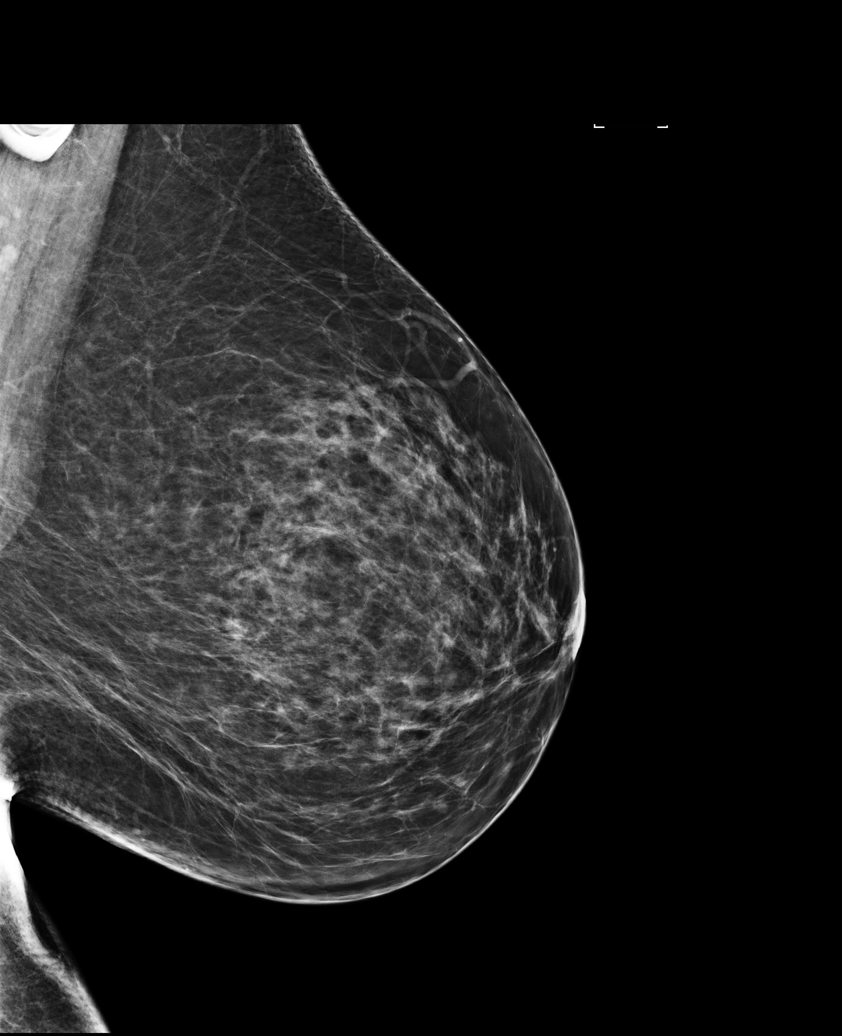

[L CC]
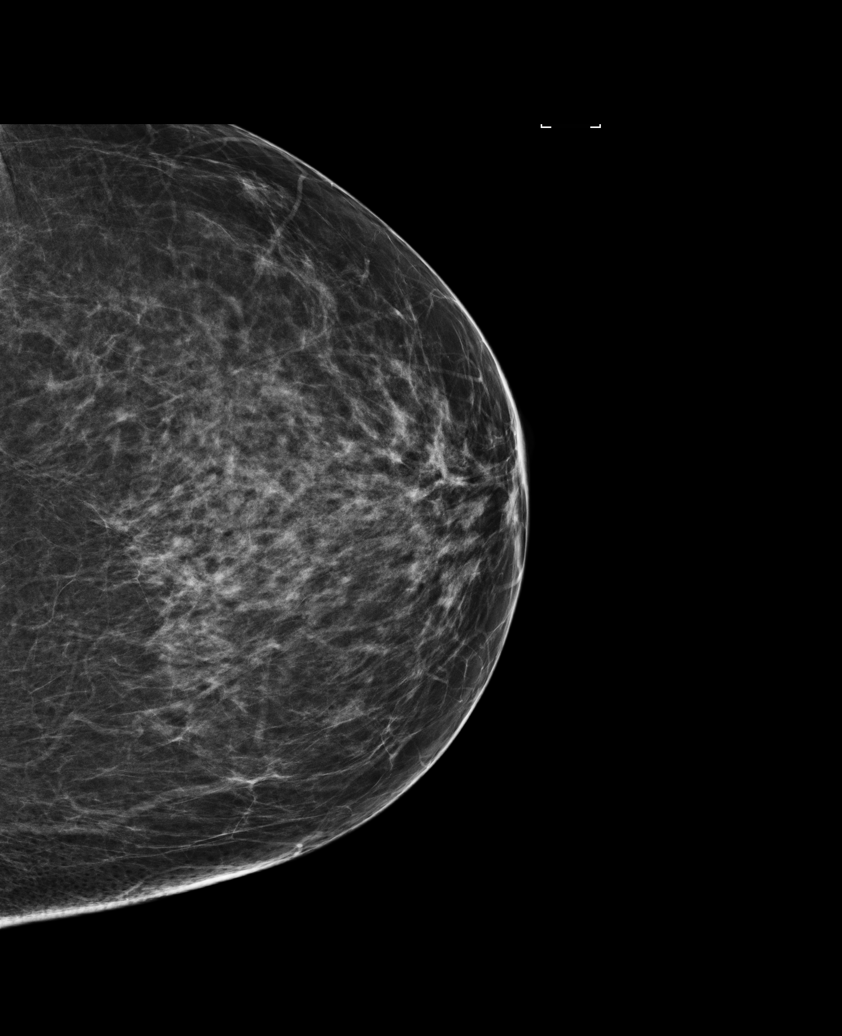

[R CC (2 of 2)]
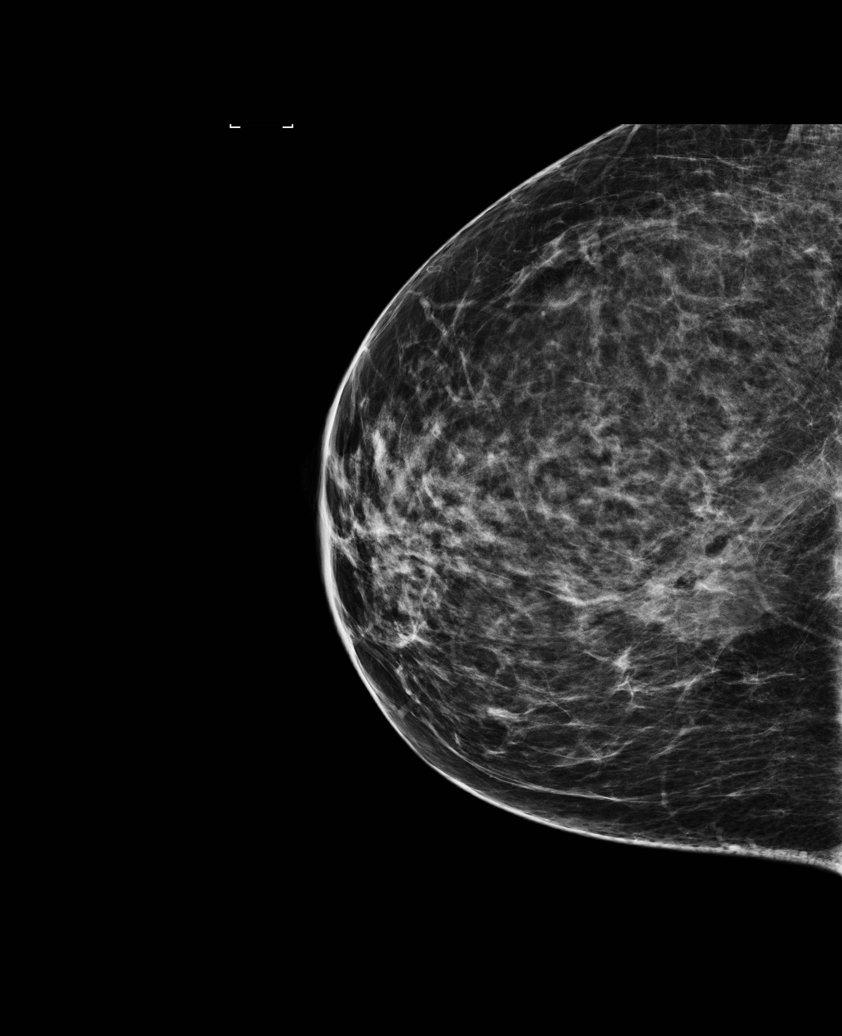

[L MLO tomo · tomo slice 45/89.0]
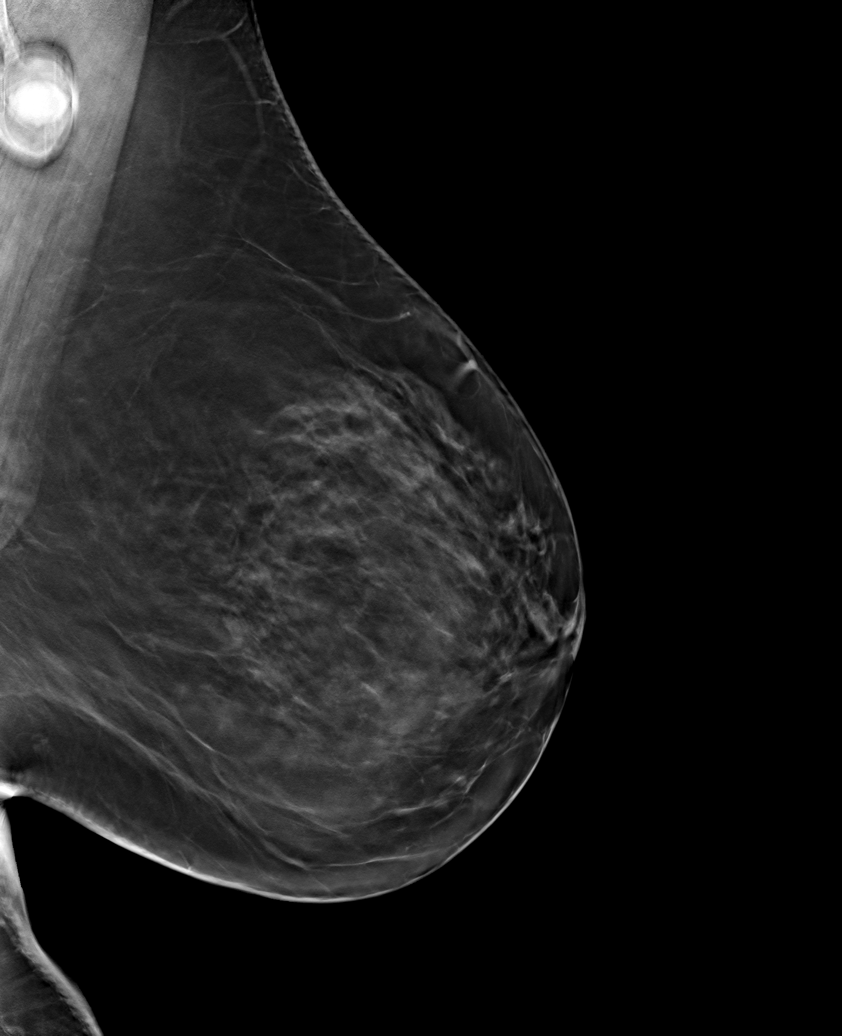

[6 of 25 positions shown; findings below may reference images not displayed]

ACR Breast Density Category b: There are scattered areas of
fibroglandular density.
FINDINGS: There are expected postsurgical changes within the right breast.
There are no new dominant masses, suspicious calcifications or
secondary signs of malignancy within either breast.

Mammographic images were processed with CAD.
IMPRESSION: No evidence of malignancy within either breast. Expected
postsurgical changes in the right breast.

RECOMMENDATION:
Bilateral diagnostic mammogram in 1 year.

I have discussed the findings and recommendations with the patient.
Results were also provided in writing at the conclusion of the
visit. If applicable, a reminder letter will be sent to the patient
regarding the next appointment.

BI-RADS CATEGORY  2: Benign.

## 2016-12-21 ENCOUNTER — Encounter (HOSPITAL_COMMUNITY): Payer: Medicaid Other

## 2016-12-21 ENCOUNTER — Ambulatory Visit (HOSPITAL_COMMUNITY): Payer: Medicaid Other | Admitting: Adult Health

## 2017-12-25 ENCOUNTER — Other Ambulatory Visit: Payer: Self-pay | Admitting: Internal Medicine

## 2017-12-25 DIAGNOSIS — Z9889 Other specified postprocedural states: Secondary | ICD-10-CM

## 2018-03-12 ENCOUNTER — Encounter: Payer: Self-pay | Admitting: *Deleted

## 2018-03-12 ENCOUNTER — Ambulatory Visit: Payer: Self-pay | Attending: Oncology | Admitting: *Deleted

## 2018-03-12 VITALS — BP 130/80 | HR 91 | Temp 98.2°F | Ht 64.5 in | Wt 182.0 lb

## 2018-03-12 DIAGNOSIS — N63 Unspecified lump in unspecified breast: Secondary | ICD-10-CM

## 2018-03-12 NOTE — Patient Instructions (Signed)
Gave patient hand-out, Women Staying Healthy, Active and Well from [facility], with education on breast health, pap smears, heart and colon health. 

## 2018-03-12 NOTE — Progress Notes (Signed)
  Subjective:     Patient ID: Tara Mcfarland, female   DOB: Apr 06, 1960, 58 y.o.   MRN: [id number]  HPI   Review of Systems     Objective:   Physical Exam Chest:          Assessment:     58 year old White female referral from [facility] for clinical breast exam and mammogram.  Patient has a history of triple negative breast cancer diagnosed in December 2016.  States she had surgery in [place] with Dr. Barry Dienes, chemo at [facility], and Radiation therapy at [facility], and is now being followed by medical oncology at [facility].  She thinks she may have had genetic testing, but is unsure.  She does not know the results.  She was getting treatment at the time the above mentioned hospitals were having financial difficulties and that is why is care was at multiple places.  She was also cared for in [place] due to moving to [place].  On clinical breast I can palpate an approximate 3 cm thickening superior to her surgical scar noted at 6:00 right breast.  I can also palpate a 2 cm mobile smooth nodule at 2:00 right breast at the edge of the areola.  Taught self breast awareness.  Had last pap in November 2019 at [facility].  Those results are not available for review.  Next pap per ASCCP guidelines.  Patient has been screened for eligibility.  She does not have any insurance, Medicare or Medicaid.  She also meets financial eligibility.  Hand-out given on the Affordable Care Act. Risk Assessment    Risk Scores      03/12/2018   Last edited by: Orson Slick, CMA   5-year risk: 5.6 %   Lifetime risk: 30 %            Plan:  Will get bilateral diagnostic mammogram and ultrasound.  Jeanella Anton to scheduled patient to return on 03/15/18 for her mammogram.  If no findings on imaging will refer for surgical consult.  She is agreeable to the plan.

## 2018-03-15 ENCOUNTER — Ambulatory Visit
Admission: RE | Admit: 2018-03-15 | Discharge: 2018-03-15 | Disposition: A | Discharge status: Home / Self Care | Payer: Medicaid Other | Source: Ambulatory Visit | Attending: Oncology | Admitting: Oncology

## 2018-03-15 DIAGNOSIS — N63 Unspecified lump in unspecified breast: Secondary | ICD-10-CM

## 2018-03-16 ENCOUNTER — Other Ambulatory Visit: Payer: Self-pay

## 2018-03-16 DIAGNOSIS — N63 Unspecified lump in unspecified breast: Secondary | ICD-10-CM

## 2018-03-19 ENCOUNTER — Other Ambulatory Visit: Payer: Self-pay

## 2018-03-19 DIAGNOSIS — N63 Unspecified lump in unspecified breast: Secondary | ICD-10-CM

## 2018-03-22 ENCOUNTER — Ambulatory Visit
Admission: RE | Admit: 2018-03-22 | Discharge: 2018-03-22 | Disposition: A | Discharge status: Home / Self Care | Payer: Medicaid Other | Source: Ambulatory Visit | Attending: Oncology | Admitting: Oncology

## 2018-03-22 DIAGNOSIS — N63 Unspecified lump in unspecified breast: Secondary | ICD-10-CM

## 2018-03-23 LAB — SURGICAL PATHOLOGY

## 2018-04-02 ENCOUNTER — Encounter: Payer: Self-pay | Admitting: *Deleted

## 2018-04-02 NOTE — Progress Notes (Signed)
Patient with benign breast biopsy.  She is to return in one year for annual diagnostic mammogram due to history of breast cancer.  HSIS to [place].

## 2020-03-17 ENCOUNTER — Ambulatory Visit (INDEPENDENT_AMBULATORY_CARE_PROVIDER_SITE_OTHER): Payer: Medicare HMO | Admitting: Plastic Surgery

## 2020-03-17 ENCOUNTER — Encounter: Payer: Self-pay | Admitting: Plastic Surgery

## 2020-03-17 ENCOUNTER — Other Ambulatory Visit: Payer: Self-pay

## 2020-03-17 DIAGNOSIS — S21009A Unspecified open wound of unspecified breast, initial encounter: Secondary | ICD-10-CM

## 2020-03-17 NOTE — Progress Notes (Signed)
Patient ID: Clide Deutscher, female    DOB: 03-01-1960, 60 y.o.   MRN: [id number]   Chief Complaint  Patient presents with  . consult  . Breast Problem    The patient is a 60 year old female here for a consultation.  The patient had right breast cancer 5 years ago.  It was triple negative and found after an abnormal mammogram.  She has a paternal family history of breast cancer.  She has 3 children.  December 2016 the patient underwent a partial mastectomy with port placement with Dr. Barry Dienes.  Her mammogram in early 2021 showed some irregularities so she underwent excision of an area on the lower aspect of the right breast.  Likely due to the fact that she had been radiated she has had trouble healing since March.  She has been going to the wound center in [place].  She describes the lesion as being more than 7 cm deep in the past.  It is now a centimeter and a half.  She is using what sounds like a silver alginate and then Nu Gauze.  She has not been smoking since 1996. She is 5 feet 4 inches tall weighs 178 pounds.  She is disabled and has 3 children.  She has a history of Graves' disease and lupus.  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  This restricts her use of proteins and products that have mammal genes in it.   Review of Systems  Constitutional: Negative.   HENT: Negative.   Eyes: Negative.   Respiratory: Negative.   Cardiovascular: Negative.   Gastrointestinal: Negative.   Genitourinary: Negative.   Musculoskeletal: Negative.   Skin: Positive for color change and wound.  Hematological: Negative.   Psychiatric/Behavioral: Negative.     Past Medical History:  Diagnosis Date  . Anxiety   . Cancer [facility])    right breast  . Cancer of right breast, stage 2 ([place]) 12/2014   Stage 2. Right. Chemo and radiation. Lumpectomy and 4 LN removed.  Marland Kitchen GERD (gastroesophageal reflux disease)   . Graves disease   . Headache   . Hepatitis    Pt has not been told this diagnosis?  . Hypothyroidism    . Lupus (systemic lupus erythematosus) ([place]) 1997   Rare flare ups manifest as a rash with weakness and arthritis.  . Migraine headache without aura    frequent  . Personal history of chemotherapy 03/02/2015   ended 07-21-15  . Personal history of radiation therapy 09/16/2015   ended 11-14-15  . PONV (postoperative nausea and vomiting)     Past Surgical History:  Procedure Laterality Date  . BREAST BIOPSY Right 07/27/2016   [place] bx, fat necrosis  . BREAST BIOPSY Right 07/27/2016   stereo bx, fat necrosis consistent with radiation atypia, no excision needed  . BREAST LUMPECTOMY Right 01/21/2015   triple negative breast ca, clear surgical margins and negative LN  . BREAST LUMPECTOMY WITH RADIOACTIVE SEED AND SENTINEL LYMPH NODE BIOPSY Right 01/21/2015   Procedure: RIGHT BREAST LUMPECTOMY WITH RADIOACTIVE SEED AND RIGHT SENTINEL LYMPH NODE BIOPSY;  Surgeon: Stark Klein, MD;  Location: [facility];  Service: General;  Laterality: Right;  . CESAREAN SECTION    . CHOLECYSTECTOMY N/A 02/11/2016   Procedure: LAPAROSCOPIC CHOLECYSTECTOMY;  Surgeon: Vickie Epley, MD;  Location: [facility];  Service: General;  Laterality: N/A;  . PORTACATH PLACEMENT Left 01/21/2015   Procedure: INSERTION PORT-A-CATH;  Surgeon: Stark Klein, MD;  Location: [facility];  Service: General;  Laterality: Left;  . TUBAL LIGATION        Current Outpatient Medications:  .  Biotin 5000 MCG CAPS, Take 1 capsule by mouth daily., Disp: , Rfl:  .  calcium carbonate (OS-CAL - DOSED IN MG OF ELEMENTAL CALCIUM) 1250 (500 CA) MG tablet, Take 1 tablet by mouth daily. , Disp: , Rfl:  .  cholecalciferol (VITAMIN D) 1000 UNITS tablet, Take 6,000 Units by mouth daily., Disp: , Rfl:  .  cyclobenzaprine (FLEXERIL) 10 MG tablet, Take 10 mg by mouth 2 (two) times daily as needed for muscle spasms (fibrolmyalgia)., Disp: , Rfl:  .  gabapentin (NEURONTIN) 100 MG capsule, Take 100 mg by mouth 3 (three) times  daily., Disp: , Rfl:  .  levothyroxine (SYNTHROID, LEVOTHROID) 112 MCG tablet, Take 112 mcg by mouth daily., Disp: , Rfl:  .  lidocaine-prilocaine (EMLA) cream, Apply 1 application topically as needed., Disp: 30 g, Rfl: 0 .  magnesium oxide (MAG-OX) 400 MG tablet, Take 400 mg by mouth daily., Disp: , Rfl:  .  promethazine (PHENERGAN) 12.5 MG tablet, Take 1 tablet (12.5 mg total) by mouth every 6 (six) hours as needed for nausea or vomiting., Disp: 30 tablet, Rfl: 0 .  SUMAtriptan (IMITREX) 50 MG tablet, Take 50 mg by mouth every 2 (two) hours as needed for migraine. May repeat in 2 hours if headache persists or recurs., Disp: , Rfl:  .  traMADol (ULTRAM) 50 MG tablet, Take 50 mg by mouth every 6 (six) hours as needed for moderate pain or severe pain., Disp: , Rfl:  No current facility-administered medications for this visit.  Facility-Administered Medications Ordered in Other Visits:  .  sodium chloride flush (NS) 0.9 % injection 10 mL, 10 mL, Intravenous, PRN, Holley Bouche, NP, 10 mL at 06/09/16 1610   Objective:   There were no vitals filed for this visit.  Physical Exam Vitals and nursing note reviewed.  Constitutional:      Appearance: Normal appearance.  HENT:     Head: Normocephalic and atraumatic.  Cardiovascular:     Rate and Rhythm: Normal rate.     Pulses: Normal pulses.  Pulmonary:     Effort: Pulmonary effort is normal.  Chest:    Abdominal:     General: Abdomen is flat. There is no distension.     Tenderness: There is no abdominal tenderness.  Skin:    Capillary Refill: Capillary refill takes less than 2 seconds.  Neurological:     General: No focal deficit present.     Mental Status: She is alert and oriented to person, place, and time.  Psychiatric:        Mood and Affect: Mood normal.        Behavior: Behavior normal.        Thought Content: Thought content normal.     Assessment & Plan:  Breast wound, initial encounter  I talked to the patient  about the possibility of wound products to help heal the area faster after an excision.  However the patient's allergies will make this extremely difficult.  I do not think I have anything to offer her with out the potential of making it worse because of her allergies.  My recommendation is therefore to continue with dressing changes as it is getting better is just slow and likely because of the radiation. Pictures were obtained of the patient and placed in the chart with the patient's or guardian's permission.  [place], DO

## 2020-07-07 ENCOUNTER — Other Ambulatory Visit: Payer: Self-pay

## 2020-07-07 ENCOUNTER — Encounter (HOSPITAL_COMMUNITY): Payer: Self-pay | Admitting: *Deleted

## 2020-07-07 ENCOUNTER — Emergency Department (HOSPITAL_COMMUNITY)
Admission: EM | Admit: 2020-07-07 | Discharge: 2020-07-07 | Disposition: A | Discharge status: Home / Self Care | Payer: Medicare HMO | Attending: Emergency Medicine | Admitting: Emergency Medicine

## 2020-07-07 DIAGNOSIS — E039 Hypothyroidism, unspecified: Secondary | ICD-10-CM | POA: Diagnosis not present

## 2020-07-07 DIAGNOSIS — Z9011 Acquired absence of right breast and nipple: Secondary | ICD-10-CM | POA: Diagnosis not present

## 2020-07-07 DIAGNOSIS — Z87891 Personal history of nicotine dependence: Secondary | ICD-10-CM | POA: Insufficient documentation

## 2020-07-07 DIAGNOSIS — T7840XA Allergy, unspecified, initial encounter: Secondary | ICD-10-CM | POA: Diagnosis present

## 2020-07-07 DIAGNOSIS — Z853 Personal history of malignant neoplasm of breast: Secondary | ICD-10-CM | POA: Diagnosis not present

## 2020-07-07 DIAGNOSIS — R202 Paresthesia of skin: Secondary | ICD-10-CM | POA: Diagnosis not present

## 2020-07-07 DIAGNOSIS — R197 Diarrhea, unspecified: Secondary | ICD-10-CM | POA: Insufficient documentation

## 2020-07-07 MED ORDER — EPINEPHRINE 0.3 MG/0.3ML IJ SOAJ: 0.3000 mg | INTRAMUSCULAR | 0 refills | Status: AC | PRN

## 2020-07-07 MED ORDER — METHYLPREDNISOLONE SODIUM SUCC 125 MG IJ SOLR
125.0000 mg | Freq: Once | INTRAMUSCULAR | Status: AC
  Administered 2020-07-07: 125 mg via INTRAVENOUS
  Filled 2020-07-07: qty 2

## 2020-07-07 NOTE — ED Provider Notes (Signed)
[facility] EMERGENCY DEPARTMENT Provider Note   CSN: [id number] Arrival date & time: 07/07/20  1800     History Chief Complaint  Patient presents with  . Allergic Reaction    Tara Mcfarland is a 60 y.o. 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  Patient presents with a chief complaint of allergic reaction.  Patient reports that she was cleaning her house this morning when she developed severe diarrhea.  Patient reports that she has had her alpha gal allergy triggered by cleaning products in the past.  Patient reports that she took 25 mg of Diphenhydramine.    Patient reports that she had resolution of her diarrhea however developed tingling sensation to her cheeks.  This tingling sensation has gradually worsened throughout the day.  Patient reports that her entire face is now tingling.  Patient also feels that her throat is "scratchy," and her voice sounds different.   Patient reports that she took a second dose of Diphenhydramine as well as her prescribed Pepcid approximately 1300.   Patient denies any difficulty breathing, drooling, nausea, vomiting, rash, swelling to lips or tongue.     HPI     Past Medical History:  Diagnosis Date  . Anxiety   . Cancer [facility])    right breast  . Cancer of right breast, stage 2 ([place]) 12/2014   Stage 2. Right. Chemo and radiation. Lumpectomy and 4 LN removed.  Marland Kitchen GERD (gastroesophageal reflux disease)   . Graves disease   . Headache   . Hepatitis    Pt has not been told this diagnosis?  . Hypothyroidism   . Lupus (systemic lupus erythematosus) ([place]) 1997   Rare flare ups manifest as a rash with weakness and arthritis.  . Migraine headache without aura    frequent  . Personal history of chemotherapy 03/02/2015   ended 07-21-15  . Personal history of radiation therapy 09/16/2015   ended 11-14-15  . PONV (postoperative nausea and vomiting)     Patient  Active Problem List   Diagnosis Date Noted  . Breast wound, initial encounter 03/17/2020  . Hypokalemia   . RUQ abdominal pain 02/08/2016  . Cholecystitis, acute 02/08/2016  . Hepatitis 02/06/2016  . Hypothyroidism 02/06/2016  . Elevated LFTs 02/06/2016  . Epigastric pain   . Osteoporosis 09/04/2015  . Lupus (systemic lupus erythematosus) ([place]) 09/04/2015  . Cancer of right breast, stage 2 ([place]) 09/01/2015  . Dermatitis, drug-induced 07/22/2015  . Chronic midline back pain 07/08/2015  . Dermatitis 07/01/2015  . Neuropathy 05/28/2015  . Chemotherapy-induced neutropenia ([place]) 04/01/2015  . Nausea 04/01/2015  . Encounter for antineoplastic chemotherapy 02/27/2015  . Port-A-Cath in place 02/27/2015  . Long term current use of systemic steroids 02/11/2015  . History of cardiomegaly 12/25/2014  . History of lupus 12/25/2014  . Depression 02/16/2010  . Fibromyalgia 02/16/2010  . Thyroid dysfunction 02/16/2010    Past Surgical History:  Procedure Laterality Date  . BREAST BIOPSY Right 07/27/2016   [place] bx, fat necrosis  . BREAST BIOPSY Right 07/27/2016   stereo bx, fat necrosis consistent with radiation atypia, no excision needed  . BREAST LUMPECTOMY Right 01/21/2015   triple negative breast ca, clear surgical margins and negative LN  . BREAST LUMPECTOMY WITH RADIOACTIVE SEED AND SENTINEL LYMPH NODE BIOPSY Right 01/21/2015   Procedure: RIGHT BREAST LUMPECTOMY WITH RADIOACTIVE SEED AND RIGHT SENTINEL LYMPH NODE BIOPSY;  Surgeon: Stark Klein, MD;  Location: [facility];  Service: General;  Laterality: Right;  . CESAREAN SECTION    . CHOLECYSTECTOMY N/A 02/11/2016   Procedure: LAPAROSCOPIC CHOLECYSTECTOMY;  Surgeon: Vickie Epley, MD;  Location[facility];  Service: General;  Laterality: N/A;  . PORTACATH PLACEMENT Left 01/21/2015   Procedure: INSERTION PORT-A-CATH;  Surgeon: Stark Klein, MD;  Location: [facility];  Service: General;  Laterality: Left;  .  TUBAL LIGATION       OB History    Gravida  3   Para  3   Term  3   Preterm      AB      Living        SAB      IAB      Ectopic      Multiple      Live Births              Family History  Problem Relation Age of Onset  . Alzheimer's disease Father   . Cardiomyopathy Father   . Diabetes Father   . Cancer Father   . Diabetes Other   . Hypertension Other   . Coronary artery disease Mother   . Arthritis Mother     Social History   Tobacco Use  . Smoking status: Former Smoker    Packs/day: 2.00    Types: Cigarettes    Start date: 46    Quit date: 1996    Years since quitting: 26.4  . Smokeless tobacco: Never Used  Vaping Use  . Vaping Use: Never used  Substance Use Topics  . Alcohol use: Yes    Comment: social  . Drug use: No    Home Medications Prior to Admission medications   Medication Sig Start Date End Date Taking? Authorizing Provider  Biotin 5000 MCG CAPS Take 1 capsule by mouth daily.    [provider]  calcium carbonate (OS-CAL - DOSED IN MG OF ELEMENTAL CALCIUM) 1250 (500 CA) MG tablet Take 1 tablet by mouth daily.     [provider]  cholecalciferol (VITAMIN D) 1000 UNITS tablet Take 6,000 Units by mouth daily.    [provider]  cyclobenzaprine (FLEXERIL) 10 MG tablet Take 10 mg by mouth 2 (two) times daily as needed for muscle spasms (fibrolmyalgia).    [provider]  gabapentin (NEURONTIN) 100 MG capsule Take 100 mg by mouth 3 (three) times daily.    [provider]  levothyroxine (SYNTHROID, LEVOTHROID) 112 MCG tablet Take 112 mcg by mouth daily.    [provider]  lidocaine-prilocaine (EMLA) cream Apply 1 application topically as needed. 04/28/16   Twana First, MD  magnesium oxide (MAG-OX) 400 MG tablet Take 400 mg by mouth daily.    [provider]  promethazine (PHENERGAN) 12.5 MG tablet Take 1 tablet (12.5 mg total) by mouth every 6 (six) hours as needed for  nausea or vomiting. 02/07/16   Kathie Dike, MD  SUMAtriptan (IMITREX) 50 MG tablet Take 50 mg by mouth every 2 (two) hours as needed for migraine. May repeat in 2 hours if headache persists or recurs.    [provider]  traMADol (ULTRAM) 50 MG tablet Take 50 mg by mouth every 6 (six) hours as needed for moderate pain or severe pain.    [provider]    Allergies    Alpha-gal, Eggs or egg-derived products, Penicillins, and Penicillin g  Review of Systems   Review of Systems  Constitutional: Negative for chills  and fever.  HENT: Positive for voice change. Negative for drooling, facial swelling and trouble swallowing.   Eyes: Negative for pain, discharge, redness and itching.  Respiratory: Negative for shortness of breath.   Cardiovascular: Negative for chest pain.  Gastrointestinal: Positive for diarrhea. Negative for abdominal pain, nausea and vomiting.  Musculoskeletal: Negative for back pain and neck pain.  Skin: Negative for color change and rash.  Neurological: Negative for dizziness, syncope, light-headedness and headaches.  Psychiatric/Behavioral: Negative for confusion.    Physical Exam Updated Vital Signs BP (!) 147/76 (BP Location: Left Arm)   Pulse 80   Temp 98.3 F (36.8 C) (Oral)   Resp 18   Ht 5\' 4"  (1.626 m)   Wt 78.9 kg   SpO2 100%   BMI 29.87 kg/m   Physical Exam Vitals and nursing note reviewed.  Constitutional:      General: She is not in acute distress.    Appearance: She is not ill-appearing, toxic-appearing or diaphoretic.  HENT:     Head: Normocephalic. No right periorbital erythema or left periorbital erythema.     Jaw: No trismus or pain on movement.     Mouth/Throat:     Lips: Pink. No lesions.     Mouth: No angioedema.     Pharynx: Oropharynx is clear. Uvula midline. No pharyngeal swelling, oropharyngeal exudate, posterior oropharyngeal erythema or uvula swelling.     Comments: Patient able to handle oral secretions  without difficulty.  Patient has no angioedema, or swelling to floor of mouth Eyes:     General: No scleral icterus.       Right eye: No discharge.        Left eye: No discharge.  Cardiovascular:     Rate and Rhythm: Normal rate.  Pulmonary:     Effort: Pulmonary effort is normal. No tachypnea, bradypnea or respiratory distress.     Breath sounds: Normal breath sounds. No stridor.     Comments: Patient able to speak in full complete sentences without difficulty, oxygen saturation 107 on room air. Musculoskeletal:     Cervical back: Normal range of motion and neck supple.  Skin:    General: Skin is warm and dry.     Comments: No rash or hives noted to bilateral upper extremities, torso, face or back  Neurological:     General: No focal deficit present.     Mental Status: She is alert.  Psychiatric:        Behavior: Behavior is cooperative.     ED Results / Procedures / Treatments   Labs (all labs ordered are listed, but only abnormal results are displayed) Labs Reviewed - No data to display  EKG None  Radiology No results found.  Procedures Procedures   Medications Ordered in ED Medications  methylPREDNISolone sodium succinate (SOLU-MEDROL) 125 mg/2 mL injection 125 mg (125 mg Intravenous Given 07/07/20 1841)    ED Course  I have reviewed the triage vital signs and the nursing notes.  Pertinent labs & imaging results that were available during my care of the patient were reviewed by me and considered in my medical decision making (see chart for details).    MDM Rules/Calculators/A&P                          Alert 60 year old female no acute distress, nontoxic-appearing.  Patient presents with chief complaint of allergic reaction.  She endorses diarrhea, tingling sensation to face, and scratchy feeling in throat.  Patient has taken 50 mg of diphenhydramine today.  Last took this medication approximately 1300.  Patient also took her prescribed Pepcid around this time  as well.    Physical exam patient has no rash or hives. .  Patient has no trouble handling oral secretions.  No angioedema or facial swelling observed.  Lungs clear to auscultation bilaterally.  Patient able to speak in full complete sentences.  No stridor noted.  Patient has already received Benadryl and Pepcid we will give her Solu-Medrol and reassess.  Patient reports improvement in her symptoms after receiving Solu-Medrol.  Plan to monitor patient for 1 to 2 hours post Solu-Medrol administration.  If patient continues to have no signs of anaphylaxis we will discharge.  We will give patient EpiPen and have her follow-up with primary care provider.  Patient care transferred to [place] at the end of my shift. Patient presentation, ED course, and plan of care discussed with review of all pertinent labs and imaging. Please see his/her note for further details regarding further ED course and disposition.   Final Clinical Impression(s) / ED Diagnoses Final diagnoses:  None    Rx / DC Orders ED Discharge Orders    None       Dyann Ruddle 07/07/20 Geanie Berlin, MD 07/08/20 [phone]

## 2020-07-07 NOTE — ED Triage Notes (Signed)
Pt with allergic reaction today.  Pt states she was cleaning. C/o face tingling and tongue tingling and swelling. Pt states she has alpha gal.

## 2020-07-07 NOTE — ED Provider Notes (Signed)
60 year old female with concern for allergic reaction today. Given solumdrol, will observe and dc if improving. See prior note for complete H&P. Physical Exam  BP (!) 147/76 (BP Location: Left Arm)   Pulse 80   Temp 98.3 F (36.8 C) (Oral)   Resp 18   Ht 5\' 4"  (1.626 m)   Wt 78.9 kg   SpO2 100%   BMI 29.87 kg/m   Physical Exam No significant swelling, able to speak in complete sentences and tolerate secretions. ED Course/Procedures     Procedures  MDM  Symptoms are improving, agreeable with discharge at this time.       Tacy Learn, PA-C 07/07/20 2037    Noemi Chapel, MD 07/08/20 [phone]

## 2020-07-07 NOTE — Discharge Instructions (Addendum)
[facility] to be evaluated for your allergic reaction.  Your physical exam was reassuring.  You were given steroid medication to help with your symptoms.  Please follow-up with your primary care provider.  I have given you prescription for EpiPen.  Please see paperwork for proper use.  I have given you a prescription for steroids today.  Some common side effects include feelings of extra energy, feeling warm, increased appetite, and stomach upset.  If you are diabetic your sugars may run higher than usual.    Get help right away if: You develop symptoms of an allergic reaction. You may notice them soon after you are exposed to a substance. Symptoms may include: Flushed skin. Hives. Swelling of the eyes, lips, face, mouth, tongue, or throat. Difficulty breathing, speaking, or swallowing. Wheezing. Dizziness or light-headedness. Fainting. Pain or cramping in the abdomen. Vomiting. Diarrhea. You used epinephrine. You need more medical care even if the medicine seems to be working. This is important because anaphylaxis may happen again within 72 hours (rebound anaphylaxis). You may need more doses of epinephrine.

## 2020-07-07 NOTE — ED Notes (Signed)
Pt states unable to use her epipens due to them being out of date.

## 2023-04-20 ENCOUNTER — Other Ambulatory Visit (HOSPITAL_COMMUNITY): Payer: Self-pay | Admitting: Hematology & Oncology

## 2023-04-20 ENCOUNTER — Inpatient Hospital Stay
Admission: RE | Admit: 2023-04-20 | Discharge: 2023-04-20 | Disposition: A | Discharge status: Home / Self Care | Payer: Self-pay | Source: Ambulatory Visit | Attending: Hematology & Oncology | Admitting: Hematology & Oncology

## 2023-04-20 DIAGNOSIS — Z1231 Encounter for screening mammogram for malignant neoplasm of breast: Secondary | ICD-10-CM

## 2023-04-26 ENCOUNTER — Ambulatory Visit (HOSPITAL_COMMUNITY)
Admission: RE | Admit: 2023-04-26 | Discharge: 2023-04-26 | Disposition: A | Discharge status: Home / Self Care | Source: Ambulatory Visit | Attending: Hematology & Oncology | Admitting: Hematology & Oncology

## 2023-04-26 DIAGNOSIS — Z1231 Encounter for screening mammogram for malignant neoplasm of breast: Secondary | ICD-10-CM | POA: Diagnosis present

## 2023-10-24 ENCOUNTER — Encounter: Payer: Self-pay | Admitting: Neurology

## 2024-01-10 NOTE — Progress Notes (Deleted)
 Initial neurology clinic note  Marijose Curington MRN: [id number] DOB: 02/01/1961  Referring provider: Loring Lye, MD  Primary care provider: Jesusa Caroline, MD  Reason for consult:  headaches  Subjective:  This is Ms. Nathanel Molt, a 63 y.o. ***-handed female with a medical history of breast cancer, Graves disease, SLE, fibromyalgia, OA, migraines, HLD, osteoporosis*** who presents to neurology clinic with headaches. The patient is accompanied by ***.  *** Has history of migraines and dizziness Saw ENT - got MRI that reportedly showed cerebellar infarcts and white matter changes - pt wonders if this is causing her dizziness - wanted to see neurology MRI brain w/wo contrast down on 10/13/23 for asymmetric hearing loss  B12 deficiency?  On any medications for migraine? -Sumatriptan  100 mg PRN -Topamax 100 mg (or 200 mg?)  Smoker: OCP use: Caffiene use: EtOH use: Restrictive diet: Family history of neurologic disease including headaches:   MEDICATIONS:  Outpatient Encounter Medications as of 01/24/2024  Medication Sig Note   Biotin 5000 MCG CAPS Take 1 capsule by mouth daily.    calcium carbonate (OS-CAL - DOSED IN MG OF ELEMENTAL CALCIUM) 1250 (500 CA) MG tablet Take 1 tablet by mouth daily.     cholecalciferol (VITAMIN D) 1000 UNITS tablet Take 6,000 Units by mouth daily.    cyclobenzaprine  (FLEXERIL ) 10 MG tablet Take 10 mg by mouth 2 (two) times daily as needed for muscle spasms (fibrolmyalgia).    EPINEPHrine  0.3 mg/0.3 mL IJ SOAJ injection Inject 0.3 mg into the muscle as needed for anaphylaxis.    gabapentin (NEURONTIN) 100 MG capsule Take 100 mg by mouth 3 (three) times daily.    levothyroxine  (SYNTHROID , LEVOTHROID) 112 MCG tablet Take 112 mcg by mouth daily.    lidocaine -prilocaine  (EMLA ) cream Apply 1 application topically as needed.    magnesium  oxide (MAG-OX) 400 MG tablet Take 400 mg by mouth daily.    promethazine  (PHENERGAN ) 12.5 MG tablet Take 1 tablet  (12.5 mg total) by mouth every 6 (six) hours as needed for nausea or vomiting. 02/08/2016: NOT YET FILLED   SUMAtriptan  (IMITREX ) 50 MG tablet Take 50 mg by mouth every 2 (two) hours as needed for migraine. May repeat in 2 hours if headache persists or recurs.    traMADol  (ULTRAM ) 50 MG tablet Take 50 mg by mouth every 6 (six) hours as needed for moderate pain or severe pain.    Facility-Administered Encounter Medications as of 01/24/2024  Medication   sodium chloride  flush (NS) 0.9 % injection 10 mL    PAST MEDICAL HISTORY: Past Medical History:  Diagnosis Date   Anxiety    Cancer [facility])    right breast   Cancer of right breast, stage 2 (HCC) 12/2014   Stage 2. Right. Chemo and radiation. Lumpectomy and 4 LN removed.   GERD (gastroesophageal reflux disease)    Graves disease    Headache    Hepatitis    Pt has not been told this diagnosis?   Hypothyroidism    Lupus (systemic lupus erythematosus) (HCC) 1997   Rare flare ups manifest as a rash with weakness and arthritis.   Migraine headache without aura    frequent   Personal history of chemotherapy 03/02/2015   ended 07-21-15   Personal history of radiation therapy 09/16/2015   ended 11-14-15   PONV (postoperative nausea and vomiting)     PAST SURGICAL HISTORY: Past Surgical History:  Procedure Laterality Date   BREAST BIOPSY Right 07/27/2016   us  bx, fat necrosis  BREAST BIOPSY Right 07/27/2016   stereo bx, fat necrosis consistent with radiation atypia, no excision needed   BREAST LUMPECTOMY Right 01/21/2015   triple negative breast ca, clear surgical margins and negative LN   BREAST LUMPECTOMY WITH RADIOACTIVE SEED AND SENTINEL LYMPH NODE BIOPSY Right 01/21/2015   Procedure: RIGHT BREAST LUMPECTOMY WITH RADIOACTIVE SEED AND RIGHT SENTINEL LYMPH NODE BIOPSY;  Surgeon: Jina Nephew, MD;  Location: [facility];  Service: General;  Laterality: Right;   CESAREAN SECTION     CHOLECYSTECTOMY N/A 02/11/2016    Procedure: LAPAROSCOPIC CHOLECYSTECTOMY;  Surgeon: Selinda Artist Moats, MD;  Location: [facility];  Service: General;  Laterality: N/A;   PORTACATH PLACEMENT Left 01/21/2015   Procedure: INSERTION PORT-A-CATH;  Surgeon: Jina Nephew, MD;  Location: [facility];  Service: General;  Laterality: Left;   TUBAL LIGATION      ALLERGIES: Allergies  Allergen Reactions   Alpha-Gal Anaphylaxis    This carbohydrate molecule has been found in Lidocaine  per patient. Patient states she has tolerated Carbocaine in the past at the dentist.    Egg Protein-Containing Drug Products Other (See Comments)    Severe pain In stomach area stomach swells up   Penicillins Hives    Has patient had a PCN reaction causing immediate rash, facial/tongue/throat swelling, SOB or lightheadedness with hypotension: No Has patient had a PCN reaction causing severe rash involving mucus membranes or skin necrosis: No Has patient had a PCN reaction that required hospitalization No Has patient had a PCN reaction occurring within the last 10 years: No If all of the above answers are NO, then may proceed with Cephalosporin use.    Penicillin G Rash    FAMILY HISTORY: Family History  Problem Relation Age of Onset   Alzheimer's disease Father    Cardiomyopathy Father    Diabetes Father    Cancer Father    Diabetes Other    Hypertension Other    Coronary artery disease Mother    Arthritis Mother     SOCIAL HISTORY: Social History   Tobacco Use   Smoking status: Former    Current packs/day: 0.00    Average packs/day: 2.0 packs/day for 19.0 years (38.0 ttl pk-yrs)    Types: Cigarettes    Start date: 35    Quit date: 1996    Years since quitting: 29.9   Smokeless tobacco: Never  Vaping Use   Vaping status: Never Used  Substance Use Topics   Alcohol use: Yes    Comment: social   Drug use: No   Social History   Social History Narrative   Not on file    Objective:  Vital Signs:  There were no  vitals taken for this visit.  ***  Labs and Imaging review: Internal labs: 07/22/2016: CMP significant for tbili 1.3 CBC w/ diff unremarkable  External labs: ***  Imaging/Procedures: MRI brain w/wo contrast - external (10/13/23): IMAGES NOT AVAILABLE FOR MY REVIEW Findings:    No mass or pathologic enhancement of the bilateral internal auditory canals or cerebellopontine angles. No pathologic enhancement or signal abnormality in the inner ear structures.  There is no superior semicircular canal dehiscence.   No acute intracranial hemorrhage, acute infarct, mass effect, midline shift, or extra-axial fluid collection.  No pathologic intracranial enhancement.   Small chronic right cerebellar infarcts. Scattered and patchy foci of T2/FLAIR hyperintensities in the supratentorial white matter which are nonspecific but likely reflect sequela of chronic small vessel microangiopathy.   Ventricles are normal  in size. Normal configuration of the cerebellar tonsils. The major arterial flow voids are preserved.   Calvarium is normal.  Bilateral lens replacements.  Mild mucosal thickening of the left maxillary sinus. Trace fluid in the right mastoid air cells.   IMPRESSION:  1.   No mass or pathologic enhancement in the internal auditory canals or cerebellopontine angles.  2.    No acute intracranial abnormality. Small chronic right cerebellar infarcts. Mild-moderate chronic small vessel ischemic change.  3.    Trace fluid in the right mastoid air cells.    Assessment/Plan:  Darlis Wragg is a 63 y.o. female who presents for evaluation of ***. *** has a relevant medical history of ***. *** neurological examination is pertinent for ***. Available diagnostic data is significant for ***. This constellation of symptoms and objective data would most likely localize to ***. ***  PLAN: -Blood work: *** ***  -Return to clinic ***  The impression above as well as the plan as outlined below were  extensively discussed with the patient (in the company of ***) who voiced understanding. All questions were answered to their satisfaction.  The patient was counseled on pertinent fall precautions per the printed material provided today, and as noted under the Patient Instructions section below.***  When available, results of the above investigations and possible further recommendations will be communicated to the patient via telephone/MyChart. Patient to call office if not contacted after expected testing turnaround time.   Total time spent reviewing records, interview, history/exam, documentation, and coordination of care on day of encounter:  *** min   Thank you for allowing me to participate in patient's care.  If I can answer any additional questions, I would be pleased to do so.  Venetia Potters, MD   CC: Jesusa Caroline, MD [address] [place] [address]  CC: Referring provider: Loring Lye, MD [address] [place]  [address] [place],  [place] [address]

## 2024-01-24 ENCOUNTER — Ambulatory Visit: Admitting: Neurology

## 2024-05-08 ENCOUNTER — Ambulatory Visit: Admitting: Neurology
